# Patient Record
Sex: Female | Born: 1960 | Race: White | Hispanic: No | Marital: Married | Smoking: Current every day smoker
Health system: Southern US, Community
[De-identification: ages and names within clinical notes are randomized; demographics above are authoritative.]

## PROBLEM LIST (undated history)

## (undated) DIAGNOSIS — I10 Essential (primary) hypertension: Secondary | ICD-10-CM

## (undated) DIAGNOSIS — M722 Plantar fascial fibromatosis: Secondary | ICD-10-CM

## (undated) DIAGNOSIS — F1020 Alcohol dependence, uncomplicated: Secondary | ICD-10-CM

## (undated) DIAGNOSIS — K219 Gastro-esophageal reflux disease without esophagitis: Secondary | ICD-10-CM

## (undated) DIAGNOSIS — R1013 Epigastric pain: Secondary | ICD-10-CM

## (undated) DIAGNOSIS — K921 Melena: Secondary | ICD-10-CM

---

## 2005-03-29 ENCOUNTER — Emergency Department: Payer: Self-pay | Admitting: Emergency Medicine

## 2009-09-20 ENCOUNTER — Emergency Department: Payer: Self-pay | Admitting: Emergency Medicine

## 2010-01-05 ENCOUNTER — Emergency Department: Payer: Self-pay | Admitting: Emergency Medicine

## 2013-01-08 ENCOUNTER — Ambulatory Visit: Payer: Self-pay

## 2017-03-23 ENCOUNTER — Other Ambulatory Visit: Payer: Self-pay | Admitting: Podiatry

## 2017-03-23 ENCOUNTER — Other Ambulatory Visit: Payer: Self-pay

## 2017-03-23 ENCOUNTER — Telehealth: Payer: Self-pay | Admitting: Gastroenterology

## 2017-03-23 DIAGNOSIS — M79671 Pain in right foot: Secondary | ICD-10-CM

## 2017-03-23 DIAGNOSIS — M79672 Pain in left foot: Principal | ICD-10-CM

## 2017-03-23 DIAGNOSIS — Z1211 Encounter for screening for malignant neoplasm of colon: Secondary | ICD-10-CM

## 2017-03-23 NOTE — Telephone Encounter (Signed)
Gastroenterology Pre-Procedure Review  Request Date: 04/17/17 Requesting Physician: Dr. Allen Norris  PATIENT REVIEW QUESTIONS: The patient responded to the following health history questions as indicated:    1. Are you having any GI issues? yes (ULcer) 2. Do you have a personal history of Polyps? no 3. Do you have a family history of Colon Cancer or Polyps? no 4. Diabetes Mellitus? no 5. Joint replacements in the past 12 months?no 6. Major health problems in the past 3 months?no 7. Any artificial heart valves, MVP, or defibrillator?no    MEDICATIONS & ALLERGIES:    Patient reports the following regarding taking any anticoagulation/antiplatelet therapy:   Plavix, Coumadin, Eliquis, Xarelto, Lovenox, Pradaxa, Brilinta, or Effient? no Aspirin? yes (Baby aspirin)  Patient confirms/reports the following medications:  No current outpatient prescriptions on file.   No current facility-administered medications for this visit.     Patient confirms/reports the following allergies:  Allergies not on file  No orders of the defined types were placed in this encounter.   AUTHORIZATION INFORMATION Primary Insurance: 1D#: Group #:  Secondary Insurance: 1D#: Group #:  SCHEDULE INFORMATION: Date: 04/17/17 Time: Location:[facility]

## 2017-03-23 NOTE — Telephone Encounter (Signed)
Patient called and is ready to schedule her colonoscopy. 

## 2017-04-02 ENCOUNTER — Ambulatory Visit: Payer: Medicaid Other

## 2017-04-17 ENCOUNTER — Ambulatory Visit
Admission: RE | Admit: 2017-04-17 | Discharge: 2017-04-17 | Disposition: A | Discharge status: Home / Self Care | Payer: Medicaid Other | Source: Ambulatory Visit | Attending: Gastroenterology | Admitting: Gastroenterology

## 2017-04-17 ENCOUNTER — Encounter
Admission: RE | Disposition: A | Discharge status: Home / Self Care | Payer: Self-pay | Source: Ambulatory Visit | Attending: Gastroenterology

## 2017-04-17 ENCOUNTER — Encounter: Payer: Self-pay | Admitting: Anesthesiology

## 2017-04-17 ENCOUNTER — Ambulatory Visit: Payer: Medicaid Other | Admitting: Certified Registered Nurse Anesthetist

## 2017-04-17 DIAGNOSIS — D125 Benign neoplasm of sigmoid colon: Secondary | ICD-10-CM | POA: Insufficient documentation

## 2017-04-17 DIAGNOSIS — K640 First degree hemorrhoids: Secondary | ICD-10-CM | POA: Diagnosis not present

## 2017-04-17 DIAGNOSIS — K5730 Diverticulosis of large intestine without perforation or abscess without bleeding: Secondary | ICD-10-CM | POA: Diagnosis not present

## 2017-04-17 DIAGNOSIS — Z1211 Encounter for screening for malignant neoplasm of colon: Secondary | ICD-10-CM | POA: Diagnosis not present

## 2017-04-17 DIAGNOSIS — K635 Polyp of colon: Secondary | ICD-10-CM | POA: Diagnosis not present

## 2017-04-17 HISTORY — PX: COLONOSCOPY WITH PROPOFOL: SHX5780

## 2017-04-17 SURGERY — COLONOSCOPY WITH PROPOFOL
Anesthesia: General

## 2017-04-17 MED ORDER — LIDOCAINE HCL (PF) 1 % IJ SOLN
INTRAMUSCULAR | Status: AC
  Administered 2017-04-17: 0.3 mL via INTRADERMAL
  Filled 2017-04-17: qty 2

## 2017-04-17 MED ORDER — LIDOCAINE HCL (PF) 1 % IJ SOLN
2.0000 mL | Freq: Once | INTRAMUSCULAR | Status: AC
  Administered 2017-04-17: 0.3 mL via INTRADERMAL

## 2017-04-17 MED ORDER — PROPOFOL 500 MG/50ML IV EMUL
INTRAVENOUS | Status: AC
  Filled 2017-04-17: qty 50

## 2017-04-17 MED ORDER — PROPOFOL 10 MG/ML IV BOLUS
INTRAVENOUS | Status: AC
  Filled 2017-04-17: qty 60

## 2017-04-17 MED ORDER — SODIUM CHLORIDE 0.9 % IV SOLN
INTRAVENOUS | Status: DC
  Administered 2017-04-17: 1000 mL via INTRAVENOUS
  Administered 2017-04-17: 08:00:00 via INTRAVENOUS

## 2017-04-17 MED ORDER — LIDOCAINE HCL (CARDIAC) 20 MG/ML IV SOLN
INTRAVENOUS | Status: DC | PRN
  Administered 2017-04-17: 80 mg via INTRAVENOUS

## 2017-04-17 MED ORDER — PROPOFOL 10 MG/ML IV BOLUS
INTRAVENOUS | Status: DC | PRN
  Administered 2017-04-17: 40 mg via INTRAVENOUS
  Administered 2017-04-17: 30 mg via INTRAVENOUS
  Administered 2017-04-17: 50 mg via INTRAVENOUS

## 2017-04-17 MED ORDER — PROPOFOL 500 MG/50ML IV EMUL
INTRAVENOUS | Status: DC | PRN
  Administered 2017-04-17: 100 ug/kg/min via INTRAVENOUS

## 2017-04-17 NOTE — Anesthesia Preprocedure Evaluation (Addendum)
Anesthesia Evaluation  Patient identified by MRN, date of birth, ID band Patient awake    Reviewed: Allergy & Precautions, H&P , NPO status , Patient's Chart, lab work & pertinent test results  History of Anesthesia Complications Negative for: history of anesthetic complications  Airway Mallampati: III  TM Distance: <3 FB Neck ROM: limited    Dental  (+) Chipped, Poor Dentition, Missing, Edentulous Upper   Pulmonary asthma , COPD,           Cardiovascular Exercise Tolerance: Good (-) angina(-) Past MI negative cardio ROS       Neuro/Psych negative neurological ROS  negative psych ROS   GI/Hepatic negative GI ROS, Neg liver ROS, neg GERD  ,  Endo/Other  negative endocrine ROS  Renal/GU negative Renal ROS  negative genitourinary   Musculoskeletal   Abdominal   Peds  Hematology negative hematology ROS (+)   Anesthesia Other Findings  BMI    Body Mass Index:  22.13 kg/m      Reproductive/Obstetrics negative OB ROS                             Anesthesia Physical Anesthesia Plan  ASA: III  Anesthesia Plan: General   Post-op Pain Management:    Induction: Intravenous  PONV Risk Score and Plan: 3 and Propofol infusion  Airway Management Planned: Natural Airway and Nasal Cannula  Additional Equipment:   Intra-op Plan:   Post-operative Plan:   Informed Consent: I have reviewed the patients History and Physical, chart, labs and discussed the procedure including the risks, benefits and alternatives for the proposed anesthesia with the patient or authorized representative who has indicated his/her understanding and acceptance.   Dental Advisory Given  Plan Discussed with: Anesthesiologist, CRNA and Surgeon  Anesthesia Plan Comments: (Patient consented for risks of anesthesia including but not limited to:  - adverse reactions to medications - risk of intubation if required -  damage to teeth, lips or other oral mucosa - sore throat or hoarseness - Damage to heart, brain, lungs or loss of life  Patient voiced understanding.)       Anesthesia Quick Evaluation

## 2017-04-17 NOTE — Anesthesia Post-op Follow-up Note (Signed)
Anesthesia QCDR form completed.        

## 2017-04-17 NOTE — H&P (Signed)
   Lucilla Lame, MD [address] [place], [place] [address] Phone: [phone] Fax : [phone]  Primary Care Physician:  Letta Median, MD Primary Gastroenterologist:  Dr. Allen Norris  Pre-Procedure History & Physical: HPI:  Allison Barry is a 56 y.o. female is here for a screening colonoscopy.   History reviewed. No pertinent past medical history.  History reviewed. No pertinent surgical history.  Prior to Admission medications   Not on File    Allergies as of 03/26/2017  . (Not on File)    History reviewed. No pertinent family history.  Social History   Socioeconomic History  . Marital status: Married    Spouse name: Not on file  . Number of children: Not on file  . Years of education: Not on file  . Highest education level: Not on file  Social Needs  . Financial resource strain: Not on file  . Food insecurity - worry: Not on file  . Food insecurity - inability: Not on file  . Transportation needs - medical: Not on file  . Transportation needs - non-medical: Not on file  Occupational History  . Not on file  Tobacco Use  . Smoking status: Not on file  Substance and Sexual Activity  . Alcohol use: Not on file  . Drug use: Not on file  . Sexual activity: Not on file  Other Topics Concern  . Not on file  Social History Narrative  . Not on file    Review of Systems: See HPI, otherwise negative ROS  Physical Exam: BP (!) 160/88   Pulse 85   Temp 97.7 F (36.5 C) (Tympanic)   Resp 17   Ht 5\' 2"  (1.575 m)   Wt 121 lb (54.9 kg)   SpO2 100%   BMI 22.13 kg/m  General:   Alert,  pleasant and cooperative in NAD Head:  Normocephalic and atraumatic. Neck:  Supple; no masses or thyromegaly. Lungs:  Clear throughout to auscultation.    Heart:  Regular rate and rhythm. Abdomen:  Soft, nontender and nondistended. Normal bowel sounds, without guarding, and without rebound.   Neurologic:  Alert and  oriented x4;  grossly normal  neurologically.  Impression/Plan: Allison Barry is now here to undergo a screening colonoscopy.  Risks, benefits, and alternatives regarding colonoscopy have been reviewed with the patient.  Questions have been answered.  All parties agreeable.

## 2017-04-17 NOTE — Transfer of Care (Signed)
Immediate Anesthesia Transfer of Care Note  Patient: Allison Barry  Procedure(s) Performed: COLONOSCOPY WITH PROPOFOL (N/A )  Patient Location: PACU and Endoscopy Unit  Anesthesia Type:General  Level of Consciousness: awake, alert , oriented and patient cooperative  Airway & Oxygen Therapy: Patient Spontanous Breathing  Post-op Assessment: Report given to RN, Post -op Vital signs reviewed and stable and Patient moving all extremities  Post vital signs: Reviewed and stable  Last Vitals:  Vitals:   04/17/17 0742 04/17/17 0840  BP: (!) 160/88 118/82  Pulse: 85 92  Resp: 17 18  Temp: 36.5 C (!) 36.3 C  SpO2: 100% 100%    Last Pain:  Vitals:   04/17/17 0840  TempSrc: Tympanic         Complications: No apparent anesthesia complications

## 2017-04-17 NOTE — Op Note (Signed)
[facility] Gastroenterology Patient Name: Allison Barry Procedure Date: 04/17/2017 8:02 AM MRN: [id number] Account #: [id number] Date of Birth: 08-11-1960 Admit Type: Outpatient Age: 56 Room: [facility] ENDO ROOM 4 Gender: Female Note Status: Finalized Procedure:            Colonoscopy Indications:          Screening for colorectal malignant neoplasm Providers:            Lucilla Lame MD, MD Referring MD:         Baxter Kail. Rebeca Alert MD, MD (Referring MD) Medicines:            Propofol per Anesthesia Complications:        No immediate complications. Procedure:            Pre-Anesthesia Assessment:                       - Prior to the procedure, a History and Physical was                        performed, and patient medications and allergies were                        reviewed. The patient's tolerance of previous                        anesthesia was also reviewed. The risks and benefits of                        the procedure and the sedation options and risks were                        discussed with the patient. All questions were                        answered, and informed consent was obtained. Prior                        Anticoagulants: The patient has taken no previous                        anticoagulant or antiplatelet agents. ASA Grade                        Assessment: II - A patient with mild systemic disease.                        After reviewing the risks and benefits, the patient was                        deemed in satisfactory condition to undergo the                        procedure.                       After obtaining informed consent, the colonoscope was                        passed under direct vision. Throughout the procedure,  the patient's blood pressure, pulse, and oxygen                        saturations were monitored continuously. The                        Colonoscope was introduced through the anus and                         advanced to the the cecum, identified by appendiceal                        orifice and ileocecal valve. The colonoscopy was                        performed without difficulty. The patient tolerated the                        procedure well. The quality of the bowel preparation                        was fair. Findings:      The perianal and digital rectal examinations were normal.      A 7 mm polyp was found in the sigmoid colon. The polyp was pedunculated.       The polyp was removed with a hot snare. Resection and retrieval were       complete.      A 3 mm polyp was found in the sigmoid colon. The polyp was sessile. The       polyp was removed with a cold snare. Resection and retrieval were       complete.      Multiple small-mouthed diverticula were found in the sigmoid colon.      Non-bleeding internal hemorrhoids were found during retroflexion. The       hemorrhoids were Grade I (internal hemorrhoids that do not prolapse). Impression:           - Preparation of the colon was fair.                       - One 7 mm polyp in the sigmoid colon, removed with a                        hot snare. Resected and retrieved.                       - One 3 mm polyp in the sigmoid colon, removed with a                        cold snare. Resected and retrieved.                       - Diverticulosis in the sigmoid colon.                       - Non-bleeding internal hemorrhoids. Recommendation:       - Await pathology results.                       - Discharge patient to home.                       -  Resume previous diet.                       - Continue present medications.                       - Repeat colonoscopy in 3 years if polyp adenoma and 10                        years if hyperplastic Procedure Code(s):    --- Professional ---                       [phone], Colonoscopy, flexible; with removal of tumor(s),                        polyp(s), or other lesion(s) by snare  technique Diagnosis Code(s):    --- Professional ---                       Z12.11, Encounter for screening for malignant neoplasm                        of colon                       D12.5, Benign neoplasm of sigmoid colon CPT copyright 2016 American Medical Association. All rights reserved. The codes documented in this report are preliminary and upon coder review may  be revised to meet current compliance requirements. Lucilla Lame MD, MD 04/17/2017 8:42:18 AM This report has been signed electronically. Number of Addenda: 0 Note Initiated On: 04/17/2017 8:02 AM Scope Withdrawal Time: 0 hours 17 minutes 27 seconds  Total Procedure Duration: 0 hours 30 minutes 23 seconds       [facility]

## 2017-04-17 NOTE — Anesthesia Postprocedure Evaluation (Signed)
Anesthesia Post Note  Patient: Allison Barry  Procedure(s) Performed: COLONOSCOPY WITH PROPOFOL (N/A )  Patient location during evaluation: Endoscopy Anesthesia Type: General Level of consciousness: awake and alert Pain management: pain level controlled Vital Signs Assessment: post-procedure vital signs reviewed and stable Respiratory status: spontaneous breathing, nonlabored ventilation, respiratory function stable and patient connected to nasal cannula oxygen Cardiovascular status: blood pressure returned to baseline and stable Postop Assessment: no apparent nausea or vomiting Anesthetic complications: no     Last Vitals:  Vitals:   04/17/17 0910 04/17/17 0920  BP: (!) 154/87 (!) 156/87  Pulse:  76  Resp:  17  Temp:    SpO2:  100%    Last Pain:  Vitals:   04/17/17 0840  TempSrc: Tympanic                 Precious Haws Piscitello

## 2017-04-18 ENCOUNTER — Encounter: Payer: Self-pay | Admitting: Gastroenterology

## 2017-04-18 LAB — SURGICAL PATHOLOGY

## 2017-04-19 ENCOUNTER — Encounter: Payer: Self-pay | Admitting: Gastroenterology

## 2017-06-30 ENCOUNTER — Ambulatory Visit
Admission: RE | Admit: 2017-06-30 | Discharge: 2017-06-30 | Disposition: A | Discharge status: Home / Self Care | Payer: Medicaid Other | Source: Ambulatory Visit | Attending: Podiatry | Admitting: Podiatry

## 2017-06-30 ENCOUNTER — Encounter: Payer: Self-pay | Admitting: Radiology

## 2017-06-30 DIAGNOSIS — M7752 Other enthesopathy of left foot: Secondary | ICD-10-CM | POA: Insufficient documentation

## 2017-06-30 DIAGNOSIS — M898X7 Other specified disorders of bone, ankle and foot: Secondary | ICD-10-CM | POA: Insufficient documentation

## 2017-06-30 DIAGNOSIS — M79671 Pain in right foot: Secondary | ICD-10-CM

## 2017-06-30 DIAGNOSIS — M85671 Other cyst of bone, right ankle and foot: Secondary | ICD-10-CM | POA: Diagnosis present

## 2017-06-30 DIAGNOSIS — M79672 Pain in left foot: Secondary | ICD-10-CM

## 2017-06-30 LAB — POCT I-STAT CREATININE: Creatinine, Ser: 0.6 mg/dL (ref 0.44–1.00)

## 2017-06-30 MED ORDER — GADOBENATE DIMEGLUMINE 529 MG/ML IV SOLN
11.0000 mL | Freq: Once | INTRAVENOUS | Status: AC | PRN
  Administered 2017-06-30: 11 mL via INTRAVENOUS

## 2017-07-31 ENCOUNTER — Other Ambulatory Visit: Payer: Self-pay | Admitting: Nurse Practitioner

## 2017-07-31 DIAGNOSIS — K921 Melena: Secondary | ICD-10-CM

## 2017-07-31 DIAGNOSIS — F1020 Alcohol dependence, uncomplicated: Secondary | ICD-10-CM

## 2017-07-31 DIAGNOSIS — R1013 Epigastric pain: Secondary | ICD-10-CM

## 2017-08-13 ENCOUNTER — Ambulatory Visit
Admission: RE | Admit: 2017-08-13 | Discharge: 2017-08-13 | Disposition: A | Discharge status: Home / Self Care | Payer: Medicaid Other | Source: Ambulatory Visit | Attending: Nurse Practitioner | Admitting: Nurse Practitioner

## 2017-08-13 DIAGNOSIS — K921 Melena: Secondary | ICD-10-CM | POA: Diagnosis not present

## 2017-08-13 DIAGNOSIS — R1013 Epigastric pain: Secondary | ICD-10-CM | POA: Diagnosis not present

## 2017-08-13 DIAGNOSIS — I700 Atherosclerosis of aorta: Secondary | ICD-10-CM | POA: Diagnosis not present

## 2017-08-13 DIAGNOSIS — R911 Solitary pulmonary nodule: Secondary | ICD-10-CM | POA: Insufficient documentation

## 2017-08-13 DIAGNOSIS — F1020 Alcohol dependence, uncomplicated: Secondary | ICD-10-CM | POA: Insufficient documentation

## 2017-08-13 HISTORY — DX: Essential (primary) hypertension: I10

## 2017-08-13 MED ORDER — IOPAMIDOL (ISOVUE-300) INJECTION 61%
75.0000 mL | Freq: Once | INTRAVENOUS | Status: AC | PRN
  Administered 2017-08-13: 75 mL via INTRAVENOUS

## 2017-08-23 ENCOUNTER — Encounter: Payer: Self-pay | Admitting: *Deleted

## 2017-08-24 ENCOUNTER — Encounter
Admission: RE | Disposition: A | Discharge status: Home / Self Care | Payer: Self-pay | Source: Ambulatory Visit | Attending: Unknown Physician Specialty

## 2017-08-24 ENCOUNTER — Ambulatory Visit: Payer: Medicaid Other | Admitting: Certified Registered Nurse Anesthetist

## 2017-08-24 ENCOUNTER — Encounter: Payer: Self-pay | Admitting: Certified Registered Nurse Anesthetist

## 2017-08-24 ENCOUNTER — Ambulatory Visit
Admission: RE | Admit: 2017-08-24 | Discharge: 2017-08-24 | Disposition: A | Discharge status: Home / Self Care | Payer: Medicaid Other | Source: Ambulatory Visit | Attending: Unknown Physician Specialty | Admitting: Unknown Physician Specialty

## 2017-08-24 DIAGNOSIS — Z79899 Other long term (current) drug therapy: Secondary | ICD-10-CM | POA: Diagnosis not present

## 2017-08-24 DIAGNOSIS — F17210 Nicotine dependence, cigarettes, uncomplicated: Secondary | ICD-10-CM | POA: Diagnosis not present

## 2017-08-24 DIAGNOSIS — R1013 Epigastric pain: Secondary | ICD-10-CM | POA: Diagnosis present

## 2017-08-24 DIAGNOSIS — F1020 Alcohol dependence, uncomplicated: Secondary | ICD-10-CM | POA: Insufficient documentation

## 2017-08-24 DIAGNOSIS — K2930 Chronic superficial gastritis without bleeding: Secondary | ICD-10-CM | POA: Insufficient documentation

## 2017-08-24 DIAGNOSIS — K449 Diaphragmatic hernia without obstruction or gangrene: Secondary | ICD-10-CM | POA: Diagnosis not present

## 2017-08-24 DIAGNOSIS — I10 Essential (primary) hypertension: Secondary | ICD-10-CM | POA: Insufficient documentation

## 2017-08-24 DIAGNOSIS — K219 Gastro-esophageal reflux disease without esophagitis: Secondary | ICD-10-CM | POA: Insufficient documentation

## 2017-08-24 HISTORY — DX: Gastro-esophageal reflux disease without esophagitis: K21.9

## 2017-08-24 HISTORY — PX: ESOPHAGOGASTRODUODENOSCOPY (EGD) WITH PROPOFOL: SHX5813

## 2017-08-24 HISTORY — DX: Alcohol dependence, uncomplicated: F10.20

## 2017-08-24 HISTORY — DX: Melena: K92.1

## 2017-08-24 HISTORY — DX: Plantar fascial fibromatosis: M72.2

## 2017-08-24 HISTORY — DX: Epigastric pain: R10.13

## 2017-08-24 LAB — URINE DRUG SCREEN, QUALITATIVE (ARMC ONLY)
Amphetamines, Ur Screen: NOT DETECTED
BARBITURATES, UR SCREEN: NOT DETECTED
BENZODIAZEPINE, UR SCRN: NOT DETECTED
CANNABINOID 50 NG, UR ~~LOC~~: NOT DETECTED
COCAINE METABOLITE, UR ~~LOC~~: NOT DETECTED
MDMA (Ecstasy)Ur Screen: NOT DETECTED
METHADONE SCREEN, URINE: NOT DETECTED
OPIATE, UR SCREEN: NOT DETECTED
Phencyclidine (PCP) Ur S: NOT DETECTED
Tricyclic, Ur Screen: NOT DETECTED

## 2017-08-24 SURGERY — ESOPHAGOGASTRODUODENOSCOPY (EGD) WITH PROPOFOL
Anesthesia: General

## 2017-08-24 MED ORDER — SODIUM CHLORIDE 0.9 % IV SOLN
INTRAVENOUS | Status: DC
  Administered 2017-08-24: 1000 mL via INTRAVENOUS

## 2017-08-24 MED ORDER — LIDOCAINE HCL (PF) 1 % IJ SOLN
INTRAMUSCULAR | Status: AC
  Administered 2017-08-24: 0.3 mL
  Filled 2017-08-24: qty 2

## 2017-08-24 MED ORDER — SODIUM CHLORIDE 0.9 % IV SOLN: INTRAVENOUS | Status: DC

## 2017-08-24 MED ORDER — LIDOCAINE HCL (CARDIAC) 20 MG/ML IV SOLN
INTRAVENOUS | Status: DC | PRN
  Administered 2017-08-24: 50 mg via INTRAVENOUS

## 2017-08-24 MED ORDER — LIDOCAINE HCL (PF) 2 % IJ SOLN
INTRAMUSCULAR | Status: AC
  Filled 2017-08-24: qty 10

## 2017-08-24 MED ORDER — PROPOFOL 10 MG/ML IV BOLUS
INTRAVENOUS | Status: DC | PRN
  Administered 2017-08-24: 290 mg via INTRAVENOUS

## 2017-08-24 MED ORDER — PROPOFOL 500 MG/50ML IV EMUL
INTRAVENOUS | Status: AC
  Filled 2017-08-24: qty 50

## 2017-08-24 NOTE — Anesthesia Preprocedure Evaluation (Signed)
Anesthesia Evaluation  Patient identified by MRN, date of birth, ID band Patient awake    Reviewed: Allergy & Precautions, H&P , NPO status , Patient's Chart, lab work & pertinent test results  History of Anesthesia Complications Negative for: history of anesthetic complications  Airway Mallampati: III  TM Distance: <3 FB Neck ROM: limited    Dental  (+) Chipped, Poor Dentition, Missing, Edentulous Upper   Pulmonary neg shortness of breath, asthma , COPD,  COPD inhaler, neg recent URI, Current Smoker,           Cardiovascular Exercise Tolerance: Good hypertension, (-) angina(-) Past MI negative cardio ROS       Neuro/Psych negative neurological ROS  negative psych ROS   GI/Hepatic negative GI ROS, Neg liver ROS, neg GERD  ,  Endo/Other  negative endocrine ROS  Renal/GU negative Renal ROS  negative genitourinary   Musculoskeletal   Abdominal   Peds  Hematology negative hematology ROS (+)   Anesthesia Other Findings  BMI    Body Mass Index:  22.13 kg/m      Reproductive/Obstetrics negative OB ROS                             Anesthesia Physical  Anesthesia Plan  ASA: III  Anesthesia Plan: General   Post-op Pain Management:    Induction: Intravenous  PONV Risk Score and Plan: 3 and Propofol infusion  Airway Management Planned: Natural Airway and Nasal Cannula  Additional Equipment:   Intra-op Plan:   Post-operative Plan:   Informed Consent: I have reviewed the patients History and Physical, chart, labs and discussed the procedure including the risks, benefits and alternatives for the proposed anesthesia with the patient or authorized representative who has indicated his/her understanding and acceptance.   Dental Advisory Given  Plan Discussed with: Anesthesiologist, CRNA and Surgeon  Anesthesia Plan Comments: (Patient consented for risks of anesthesia including but  not limited to:  - adverse reactions to medications - risk of intubation if required - damage to teeth, lips or other oral mucosa - sore throat or hoarseness - Damage to heart, brain, lungs or loss of life  Patient voiced understanding.)        Anesthesia Quick Evaluation

## 2017-08-24 NOTE — Transfer of Care (Signed)
Immediate Anesthesia Transfer of Care Note  Patient: Allison Barry  Procedure(s) Performed: ESOPHAGOGASTRODUODENOSCOPY (EGD) WITH PROPOFOL (N/A )  Patient Location: PACU and Endoscopy Unit  Anesthesia Type:General  Level of Consciousness: drowsy  Airway & Oxygen Therapy: Patient Spontanous Breathing and Patient connected to nasal cannula oxygen  Post-op Assessment: Report given to RN and Post -op Vital signs reviewed and stable  Post vital signs: Reviewed and stable  Last Vitals:  Vitals:   08/24/17 1128  BP: 139/82  Pulse: 83  Resp: 17  Temp: 36.7 C  SpO2: 99%    Last Pain:  Vitals:   08/24/17 1128  TempSrc: Tympanic         Complications: No apparent anesthesia complications

## 2017-08-24 NOTE — Anesthesia Post-op Follow-up Note (Signed)
Anesthesia QCDR form completed.        

## 2017-08-24 NOTE — H&P (Signed)
   Primary Care Physician:  Letta Median, MD Primary Gastroenterologist:  Dr. Vira Agar  Pre-Procedure History & Physical: HPI:  Allison Barry is a 57 y.o. female is here for an endoscopy which is for abdominal pain.   Past Medical History:  Diagnosis Date  . Alcoholism ([place])   . Epigastric pain   . GERD (gastroesophageal reflux disease)   . Hypertension   . Melena   . Plantar fasciitis     Past Surgical History:  Procedure Laterality Date  . COLONOSCOPY WITH PROPOFOL N/A 04/17/2017   Procedure: COLONOSCOPY WITH PROPOFOL;  Surgeon: Lucilla Lame, MD;  Location: [facility] ENDOSCOPY;  Service: Endoscopy;  Laterality: N/A;    Prior to Admission medications   Medication Sig Start Date End Date Taking? Authorizing Provider  hydrochlorothiazide (MICROZIDE) 12.5 MG capsule Take 12.5 mg by mouth daily.   Yes [provider]  ibuprofen (ADVIL) 200 MG tablet Take 200 mg by mouth every 6 (six) hours as needed.   Yes [provider]  pantoprazole (PROTONIX) 40 MG tablet Take 40 mg by mouth daily.   Yes [provider]  traMADol (ULTRAM) 50 MG tablet Take by mouth every 6 (six) hours as needed.   Yes [provider]    Allergies as of 08/20/2017 - Review Complete 08/13/2017  Allergen Reaction Noted  . Penicillins  04/17/2017    History reviewed. No pertinent family history.  Social History   Socioeconomic History  . Marital status: Married    Spouse name: Not on file  . Number of children: Not on file  . Years of education: Not on file  . Highest education level: Not on file  Social Needs  . Financial resource strain: Not on file  . Food insecurity - worry: Not on file  . Food insecurity - inability: Not on file  . Transportation needs - medical: Not on file  . Transportation needs - non-medical: Not on file  Occupational History  . Not on file  Tobacco Use  . Smoking status: Current Every Day Smoker    Packs/day: 1.00  . Smokeless  tobacco: Never Used  Substance and Sexual Activity  . Alcohol use: Yes    Comment: 7 quarts  . Drug use: No  . Sexual activity: Not on file  Other Topics Concern  . Not on file  Social History Narrative  . Not on file    Review of Systems: See HPI, otherwise negative ROS  Physical Exam: BP (!) 82/58   Pulse 87   Temp (!) 97.4 F (36.3 C)   Resp 18   Ht 5\' 2"  (1.575 m)   Wt 56.7 kg (125 lb)   SpO2 97%   BMI 22.86 kg/m  General:   Alert,  pleasant and cooperative in NAD Head:  Normocephalic and atraumatic. Neck:  Supple; no masses or thyromegaly. Lungs:  Clear throughout to auscultation.    Heart:  Regular rate and rhythm. Abdomen:  Soft, nontender and nondistended. Normal bowel sounds, without guarding, and without rebound.   Neurologic:  Alert and  oriented x4;  grossly normal neurologically.  Impression/Plan: Allison Barry is here for an endoscopy to be performed for abdominal pain.  Risks, benefits, limitations, and alternatives regarding  endoscopy have been reviewed with the patient.  Questions have been answered.  All parties agreeable.   Gaylyn Cheers, MD  08/24/2017, 2:47 PM

## 2017-08-24 NOTE — Op Note (Signed)
[facility] Gastroenterology Patient Name: [facility] Procedure Date: 08/24/2017 2:11 PM MRN: [id number] Account #: [id number] Date of Birth: 03/04/1961 Admit Type: Outpatient Age: 57 Room: [facility] ENDO ROOM 1 Gender: Female Note Status: Finalized Procedure:            Upper GI endoscopy Indications:          Epigastric abdominal pain Providers:            Manya Silvas, MD Medicines:            Propofol per Anesthesia Complications:        No immediate complications. Procedure:            Pre-Anesthesia Assessment:                       - After reviewing the risks and benefits, the patient                        was deemed in satisfactory condition to undergo the                        procedure.                       After obtaining informed consent, the endoscope was                        passed under direct vision. Throughout the procedure,                        the patient's blood pressure, pulse, and oxygen                        saturations were monitored continuously. The Endoscope                        was introduced through the mouth, and advanced to the                        second part of duodenum. The upper GI endoscopy was                        accomplished without difficulty. The patient tolerated                        the procedure well. Findings:      The examined esophagus was normal.      A large hiatal hernia was present. GEJ at 35cm and a 5cm hiatal hernia       was seen.      Localized mild inflammation characterized by erythema and granularity       was found in the gastric body and in the gastric antrum. Biopsies were       taken with a cold forceps for histology. Biopsies were taken with a cold       forceps for Helicobacter pylori testing.      The examined duodenum was normal. Impression:           - Normal esophagus.                       - Large hiatal hernia.                       -  Gastritis. Biopsied.         - Normal examined duodenum. Recommendation:       - Await pathology results. Take medicine. Manya Silvas, MD 08/24/2017 2:36:46 PM This report has been signed electronically. Number of Addenda: 0 Note Initiated On: 08/24/2017 2:11 PM      [facility]

## 2017-08-27 ENCOUNTER — Encounter: Payer: Self-pay | Admitting: Unknown Physician Specialty

## 2017-08-27 NOTE — Anesthesia Postprocedure Evaluation (Signed)
Anesthesia Post Note  Patient: Juanette K Malcomb  Procedure(s) Performed: ESOPHAGOGASTRODUODENOSCOPY (EGD) WITH PROPOFOL (N/A )  Patient location during evaluation: Endoscopy Anesthesia Type: General Level of consciousness: awake and alert Pain management: pain level controlled Vital Signs Assessment: post-procedure vital signs reviewed and stable Respiratory status: spontaneous breathing, nonlabored ventilation and respiratory function stable Cardiovascular status: blood pressure returned to baseline and stable Postop Assessment: no apparent nausea or vomiting Anesthetic complications: no     Last Vitals:  Vitals:   08/24/17 1128 08/24/17 1436  BP: 139/82 (!) 82/58  Pulse: 83 87  Resp: 17 18  Temp: 36.7 C (!) 36.3 C  SpO2: 99% 97%    Last Pain:  Vitals:   08/25/17 0907  TempSrc:   PainSc: 0-No pain                 Alphonsus Sias

## 2017-08-29 LAB — SURGICAL PATHOLOGY

## 2018-01-17 ENCOUNTER — Other Ambulatory Visit: Payer: Self-pay | Admitting: Student

## 2018-01-17 DIAGNOSIS — I700 Atherosclerosis of aorta: Secondary | ICD-10-CM

## 2018-01-17 DIAGNOSIS — R1013 Epigastric pain: Secondary | ICD-10-CM

## 2018-02-04 ENCOUNTER — Ambulatory Visit: Admission: RE | Admit: 2018-02-04 | Payer: Medicaid Other | Source: Ambulatory Visit

## 2018-03-05 ENCOUNTER — Ambulatory Visit: Payer: Medicaid Other | Admitting: Gastroenterology

## 2018-03-25 ENCOUNTER — Ambulatory Visit
Admission: RE | Admit: 2018-03-25 | Payer: Medicaid Other | Source: Ambulatory Visit | Admitting: Unknown Physician Specialty

## 2018-03-25 ENCOUNTER — Encounter: Admission: RE | Payer: Self-pay | Source: Ambulatory Visit

## 2018-03-25 IMAGING — CT CT ABD-PELV W/ CM
2 of 6 series · 15 of 46 positions shown, 17 images · IV contrast (iopamidol)
Comparison: None.

CLINICAL DATA: Mid abdominal pain. Remote history of ulcer.
Occasional nausea, vomiting, and diarrhea.

EXAM:
CT ABDOMEN AND PELVIS WITH CONTRAST
TECHNIQUE: Multidetector CT imaging of the abdomen and pelvis was performed
using the standard protocol following bolus administration of
intravenous contrast.
CONTRAST:  75mL 6TQP8H-ZMM IOPAMIDOL (6TQP8H-ZMM) INJECTION 61%

[Series 2: abd pelvis · axial · 0.65mm/px · z∈[-1536,-1206]mm · 12 of 78 slices shown, 14 images (1 of 2)]
[im 6/78  soft-tissue]
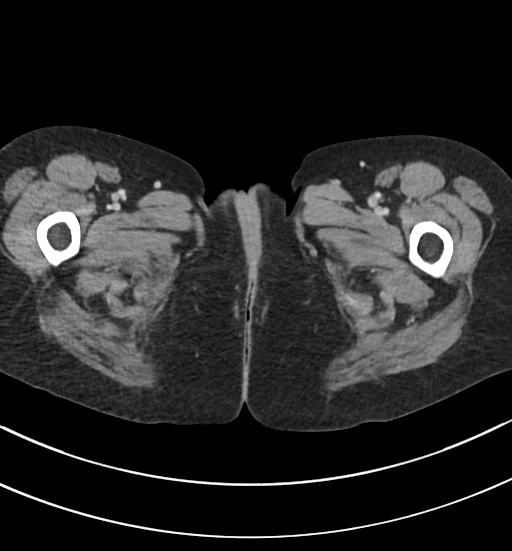
[im 6/78  bone]
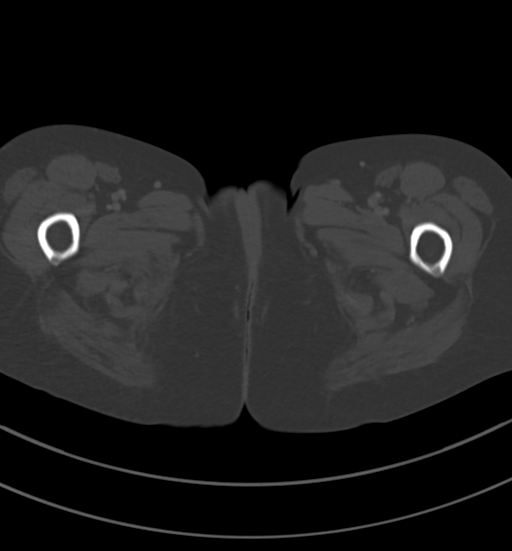
[im 11/78  soft-tissue]
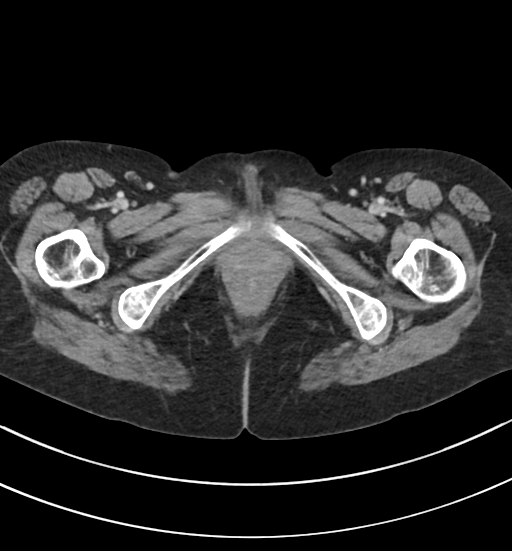
[im 16/78  soft-tissue]
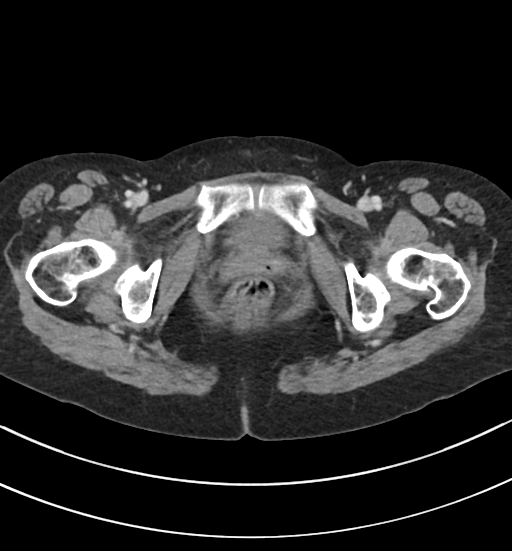
[im 26/78  soft-tissue]
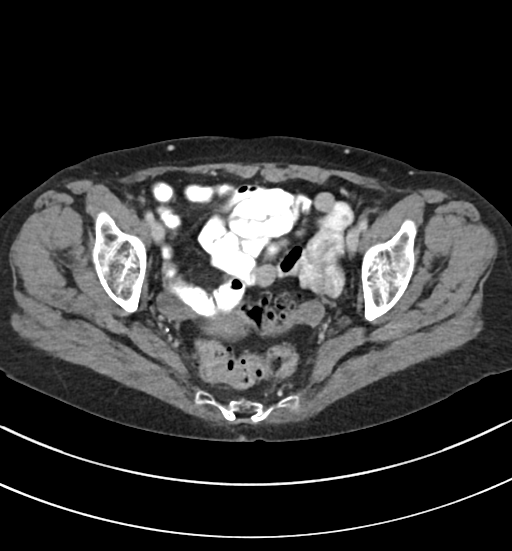
[im 31/78  soft-tissue]
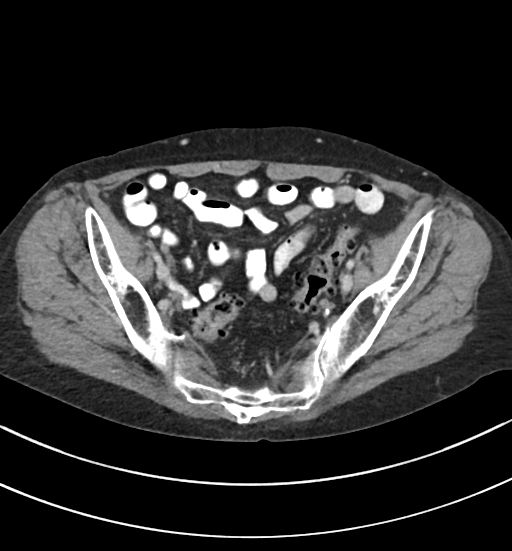
[im 36/78  soft-tissue]
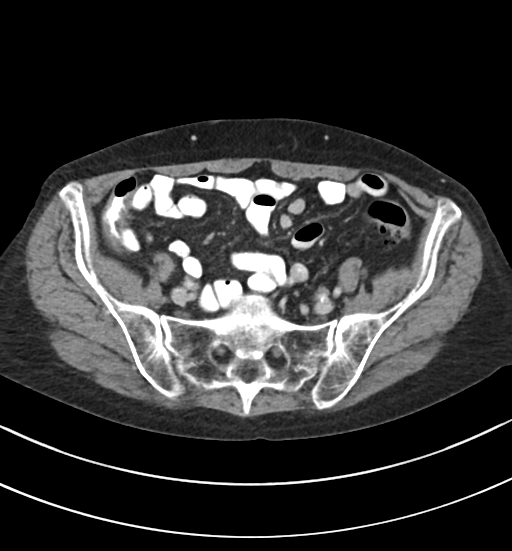
[im 42/78  soft-tissue]
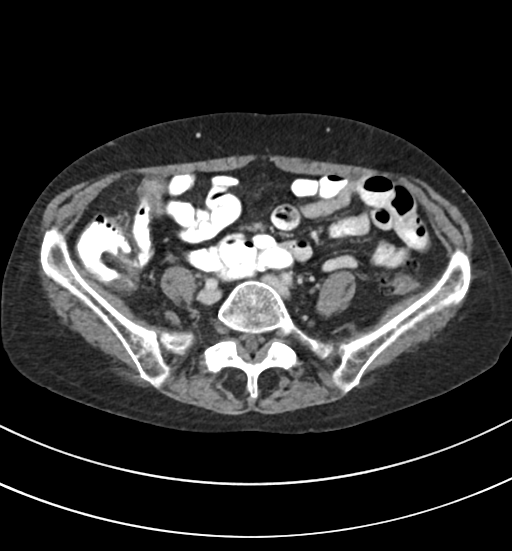
[im 47/78  soft-tissue]
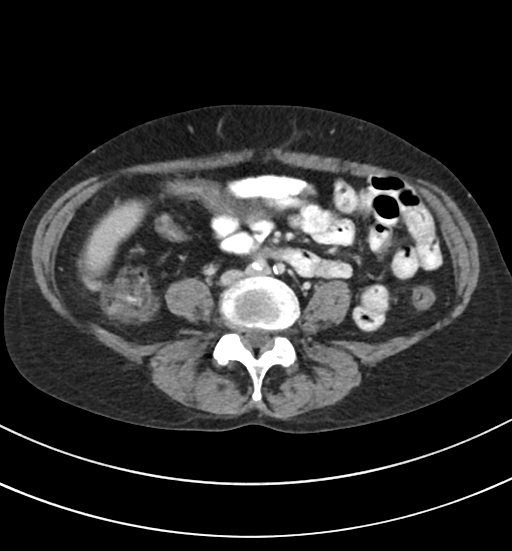
[im 52/78  soft-tissue]
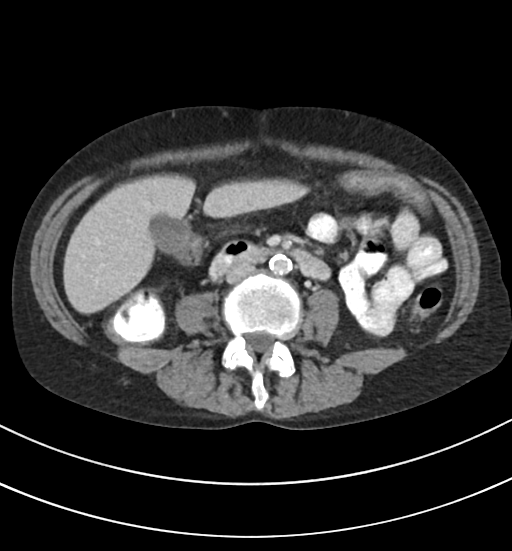
[im 52/78  bone]
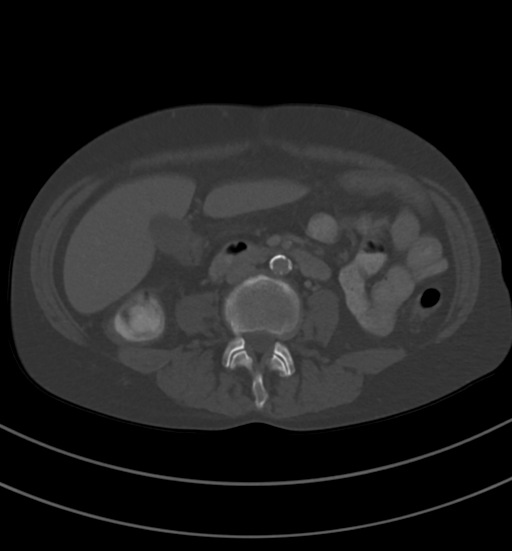
[im 62/78  soft-tissue]
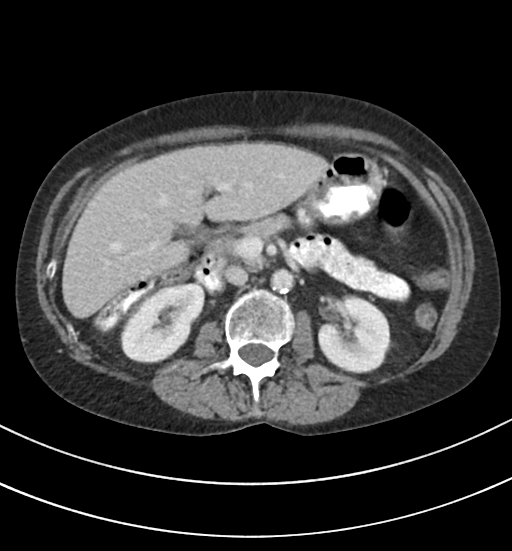
[im 67/78  soft-tissue]
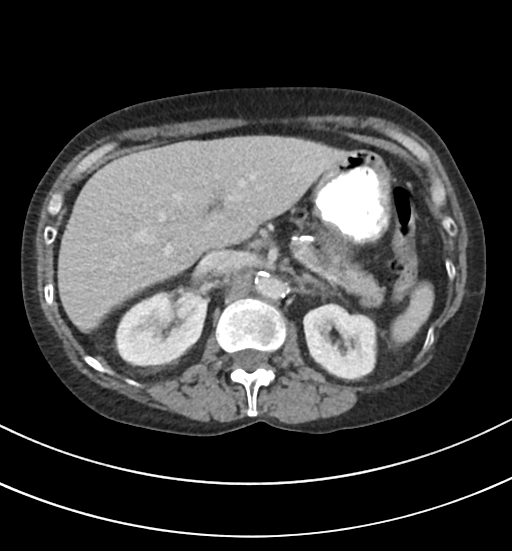
[im 72/78  soft-tissue]
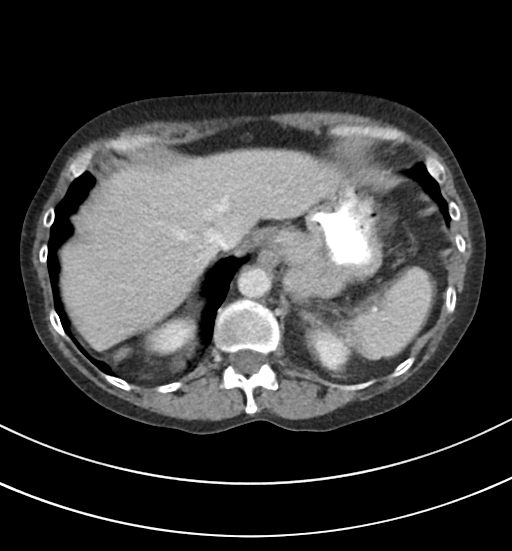

[Series 6: abd pelvis · coronal · 0.65mm/px · 3 of 116 slices shown (2 of 2)]
[im 39/116  soft-tissue]
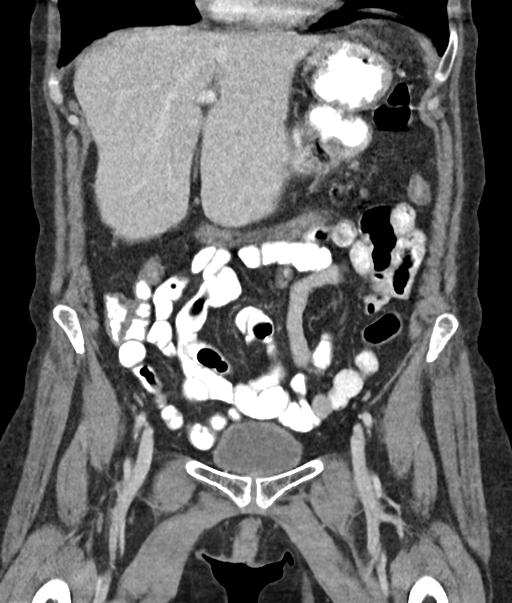
[im 52/116  soft-tissue]
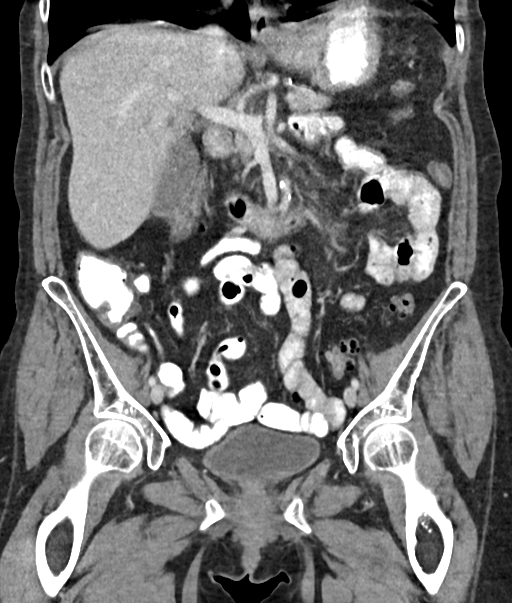
[im 64/116  soft-tissue]
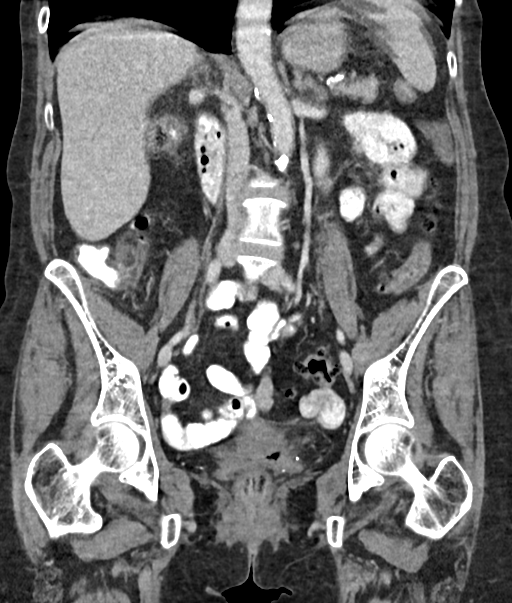

[15 of 46 positions shown; findings below may reference images not displayed]

FINDINGS: Lower chest: 4 mm right lower lobe pulmonary nodule on image [DATE].
Normal heart size without pericardial or pleural effusion.

Hepatobiliary: Minimal motion degradation in the upper abdomen on
both portal venous phase and delayed images. Normal liver. Normal
gallbladder, without biliary ductal dilatation.

Pancreas: Normal, without mass or ductal dilatation.

Spleen: Normal in size, without focal abnormality.

Adrenals/Urinary Tract: Normal adrenal glands. Normal kidneys,
without hydronephrosis. Normal urinary bladder.

Stomach/Bowel: Proximal gastric underdistention. Apparent wall
thickening is most likely secondary. Example image [DATE]. Scattered
colonic diverticula. Normal terminal ileum and appendix. Normal
small bowel.

Vascular/Lymphatic: Advanced aortic and branch vessel
atherosclerosis. No abdominopelvic adenopathy.

Reproductive: Normal uterus and adnexa.

Other: No significant free fluid.  Mild pelvic floor laxity.

Musculoskeletal: Remote right eleventh rib fracture. Mild disc
bulges at L2-3 and L3-4. Convex left lumbar spine curvature is mild.
IMPRESSION: 1. Mildly motion degraded exam.
2.  No acute process in the abdomen or pelvis.
3. Apparent soft tissue fullness in the proximal stomach is favored
to be due to underdistention. Given the complex clinical history,
including prior ulcer, gastric pathology cannot be excluded and
endoscopy should be considered.
4.  Aortic Atherosclerosis (ZC3OB-C2L.L).  This is age advanced.
5. Right lower lobe 4 mm pulmonary nodule. No follow-up needed if
patient is low-risk. Non-contrast chest CT can be considered in 12
months if patient is high-risk. This recommendation follows the
consensus statement: Guidelines for Management of Incidental
Pulmonary Nodules Detected on CT Images: From the [HOSPITAL]

## 2018-03-25 SURGERY — EGD (ESOPHAGOGASTRODUODENOSCOPY)
Anesthesia: General

## 2018-11-08 ENCOUNTER — Ambulatory Visit: Payer: Medicaid Other | Admitting: Adult Health

## 2018-11-08 ENCOUNTER — Other Ambulatory Visit: Payer: Self-pay

## 2019-12-02 ENCOUNTER — Ambulatory Visit
Admission: RE | Admit: 2019-12-02 | Discharge: 2019-12-02 | Disposition: A | Discharge status: Home / Self Care | Payer: Medicaid Other | Source: Ambulatory Visit | Attending: Family Medicine | Admitting: Family Medicine

## 2019-12-02 ENCOUNTER — Other Ambulatory Visit: Payer: Self-pay | Admitting: Family Medicine

## 2019-12-02 ENCOUNTER — Ambulatory Visit
Admission: RE | Admit: 2019-12-02 | Discharge: 2019-12-02 | Disposition: A | Discharge status: Home / Self Care | Payer: Medicaid Other | Attending: Family Medicine | Admitting: Family Medicine

## 2019-12-02 DIAGNOSIS — R600 Localized edema: Secondary | ICD-10-CM

## 2021-09-26 ENCOUNTER — Encounter: Payer: Self-pay | Admitting: Urology

## 2021-09-26 ENCOUNTER — Ambulatory Visit (INDEPENDENT_AMBULATORY_CARE_PROVIDER_SITE_OTHER): Payer: Medicaid Other | Admitting: Urology

## 2021-09-26 VITALS — BP 128/87 | HR 66 | Ht 62.0 in | Wt 125.0 lb

## 2021-09-26 DIAGNOSIS — N3946 Mixed incontinence: Secondary | ICD-10-CM | POA: Diagnosis not present

## 2021-09-26 MED ORDER — MIRABEGRON ER 50 MG PO TB24: 50.0000 mg | ORAL_TABLET | Freq: Every day | ORAL | 11 refills | Status: DC

## 2021-09-26 NOTE — Progress Notes (Signed)
? ?09/26/2021 ?9:22 AM  ? ?Allison Barry ?1961/05/01 ?[id number] ? ?Referring provider: Letta Median, MD ?[address] ?[place],  [place] [address] ? ?Chief Complaint  ?Patient presents with  ? Urinary Incontinence  ? ? ?HPI: ?I was consulted to assess the patient's urinary incontinence.  She leaks with urgency and coughing sneezing.  I think the primary symptom is urge incontinence.  She has high-volume bedwetting.  She soaks a pad at night and uses 4 pads during the day that are generally damp ? ?She voids every 1 hour and cannot hold it for 2 hours.  She gets up twice at night.  Her flow was good ? ?No hysterectomy ? ?No history of bladder surgery kidney stones or bladder infections.  No treatment ? ? ?PMH: ?Past Medical History:  ?Diagnosis Date  ? Alcoholism ([place])   ? Epigastric pain   ? GERD (gastroesophageal reflux disease)   ? Hypertension   ? Melena   ? Plantar fasciitis   ? ? ?Surgical History: ?Past Surgical History:  ?Procedure Laterality Date  ? COLONOSCOPY WITH PROPOFOL N/A 04/17/2017  ? Procedure: COLONOSCOPY WITH PROPOFOL;  Surgeon: Lucilla Lame, MD;  Location: [facility] ENDOSCOPY;  Service: Endoscopy;  Laterality: N/A;  ? ESOPHAGOGASTRODUODENOSCOPY (EGD) WITH PROPOFOL N/A 08/24/2017  ? Procedure: ESOPHAGOGASTRODUODENOSCOPY (EGD) WITH PROPOFOL;  Surgeon: Manya Silvas, MD;  Location[facility] END[facility]COPY;  Service: Endoscopy;  Laterality: N/A;  ? ? ?Home Medications:  ?Allergies as of 09/26/2021   ? ?   Reactions  ? Penicillins   ? ?  ? ?  ?Medication List  ?  ? ?  ? Accurate as of September 26, 2021  9:22 AM. If you have any questions, ask your nurse or doctor.  ?  ?  ? ?  ? ?hydrochlorothiazide 12.5 MG capsule ?Commonly known as: MICROZIDE ?Take 12.5 mg by mouth daily. ?  ?ibuprofen 200 MG tablet ?Commonly known as: ADVIL ?Take 200 mg by mouth every 6 (six) hours as needed. ?  ?pantoprazole 40 MG tablet ?Commonly known as: PROTONIX ?Take 40 mg by mouth daily. ?  ?traMADol 50 MG  tablet ?Commonly known as: ULTRAM ?Take by mouth every 6 (six) hours as needed. ?  ? ?  ? ? ?Allergies:  ?Allergies  ?Allergen Reactions  ? Penicillins   ? ? ?Family History: ?No family history on file. ? ?Social History:  reports that she has been smoking cigarettes. She has been smoking an average of 1 pack per day. She has never used smokeless tobacco. She reports current alcohol use. She reports that she does not use drugs. ? ?ROS: ?  ? ?  ? ?  ? ?  ? ?  ? ?  ? ?  ? ?  ? ?  ? ?  ? ?  ? ?  ? ?  ? ?Physical Exam: ?Ht '5\' 2"'$  (1.575 m)   Wt 56.7 kg   BMI 22.86 kg/m?   ?Constitutional:  Alert and oriented, No acute distress. ?HEENT: [place] AT, moist mucus membranes.  Trachea midline, no masses. ?Cardiovascular: No clubbing, cyanosis, or edema. ?Respiratory: Normal respiratory effort, no increased work of breathing. ?GI: Abdomen is soft, nontender, nondistended, no abdominal masses ?GU:  ?Skin: No rashes, bruises or suspicious lesions. ?Lymph: No cervical or inguinal adenopathy. ?Neurologic: Grossly intact, no focal deficits, moving all 4 extremities. ?Psychiatric: Normal mood and affect. ? ?Laboratory Data: ?No results found for: WBC, HGB, HCT, MCV, PLT ? ?Lab Results  ?Component Value Date  ?  CREATININE 0.60 06/30/2017  ? ? ?No results found for: PSA ? ?No results found for: TESTOSTERONE ? ?No results found for: HGBA1C ?Well supported bladder neck and no prolapse no stress incontinence with moderate cough ?Urinalysis ?No results found for: COLORURINE, APPEARANCEUR, [place], [place], [place], [place], [place], KETONESUR, PROTEINUR, [place], NITRITE, LEUKOCYTESUR ? ?Pertinent Imaging: ?Urine reviewed.  Urine sent for culture.  Chart reviewed ? ?Assessment & Plan: Patient has mixed incontinence and high-volume bedwetting.  She has hourly frequency and milder nocturia she may need urodynamics to reach her treatment goal.  Reassess with pelvic examination and cystoscopy in 5 or 6 weeks on Myrbetriq 50 mg samples and  prescription.  Call if urine culture is positive. ? ?Patient has a smoking history.  She had microscopic hematuria but a urine could have been infected.  If the culture comes back negative I will order CT scan. ? ?Patient understands he is coming back for cystoscopy.  I did not say she had blood in the urine because it looks like she had a bladder infection.  We will make sure we get a urine test next time and again I will explain microscopic hematuria if culture is negative ? ?1. Mixed incontinence ? ?- Urinalysis, Complete ? ? ?No follow-ups on file. ? ?Reece Packer, MD ? ?[facility] [place] ?[address] ?[place], [place] [address] ?[phone] ?  ?

## 2021-09-26 NOTE — Patient Instructions (Signed)

## 2021-09-27 LAB — URINALYSIS, COMPLETE
Bilirubin, UA: NEGATIVE
Glucose, UA: NEGATIVE
Ketones, UA: NEGATIVE
Nitrite, UA: NEGATIVE
Protein,UA: NEGATIVE
RBC, UA: NEGATIVE
Specific Gravity, UA: <1.005 — ABNORMAL LOW (ref 1.005–1.030)
Urobilinogen, Ur: 0.2 mg/dL (ref 0.2–1.0)
pH, UA: 6 (ref 5.0–7.5)

## 2021-09-27 LAB — MICROSCOPIC EXAMINATION: WBC, UA: >30 /hpf — AB (ref 0–5)

## 2021-09-29 LAB — CULTURE, URINE COMPREHENSIVE

## 2021-10-03 ENCOUNTER — Telehealth: Payer: Self-pay | Admitting: *Deleted

## 2021-10-03 MED ORDER — CIPROFLOXACIN HCL 250 MG PO TABS: 250.0000 mg | ORAL_TABLET | Freq: Two times a day (BID) | ORAL | 0 refills | Status: DC

## 2021-10-03 NOTE — Telephone Encounter (Addendum)
Patient informed, voiced understanding. Sent in RX ? ? ?----- Message from Bjorn Loser, MD sent at 10/03/2021  8:19 AM EDT ----- ?Ciprofloxacin 250 mg twice a day for 7 days ?----- Message ----- ?From: Alvera Novel, CMA ?Sent: 09/29/2021   8:20 AM EDT ?To: Bjorn Loser, MD ? ? ?----- Message ----- ?From: Interface, [facility] Lab Results In ?Sent: 09/27/2021   5:37 AM EDT ?To: Rowe [facility] Clinical ? ? ? ?

## 2021-11-08 ENCOUNTER — Telehealth: Payer: Self-pay

## 2021-11-08 NOTE — Telephone Encounter (Signed)
PA started on myrbetriq on cover my meds. KEY: [id number]

## 2021-11-14 ENCOUNTER — Other Ambulatory Visit: Payer: Medicaid Other | Admitting: Urology

## 2021-12-30 ENCOUNTER — Telehealth: Payer: Self-pay | Admitting: Urology

## 2021-12-30 NOTE — Telephone Encounter (Signed)
PT CALLED, RAN OUT OF MEDICATION - CIPRO.PLEASE PHONE INTO [facility] [address]

## 2021-12-30 NOTE — Telephone Encounter (Signed)
Called pt. Advised pt that we do not refill antibiotics. Pt is asymptomatic.

## 2022-01-03 NOTE — Telephone Encounter (Signed)
Patient called the office stating that her insurance will not cover her Myrbetriq prescription.  She cannot afford to pay for it.  2 boxes of samples were placed at the front desk to pick up for use until her next appointment.

## 2022-01-23 ENCOUNTER — Other Ambulatory Visit: Payer: Medicaid Other | Admitting: Urology

## 2022-02-15 ENCOUNTER — Telehealth: Payer: Self-pay | Admitting: Urology

## 2022-02-15 NOTE — Telephone Encounter (Signed)
Pt called, stating she has an yeast infection due to the RX Myrbetriq 50 mg.  Dody would like a RX to treat her yeast infection [facility] [address], [place].  Thank you. Pt number is [phone]

## 2022-02-16 NOTE — Telephone Encounter (Signed)
Patient returned call informed, will contact PCP

## 2022-02-16 NOTE — Telephone Encounter (Signed)
Voicemail not set up, will try again later, pt would need to follow-up with PCP or GYN.    The most common side effects of MYRBETRIQ include high blood pressure, pain or swelling of the nose or throat (nasopharyngitis), urinary tract infection, and headache.

## 2022-02-20 ENCOUNTER — Other Ambulatory Visit: Payer: Self-pay | Admitting: Family Medicine

## 2022-02-20 DIAGNOSIS — Z1231 Encounter for screening mammogram for malignant neoplasm of breast: Secondary | ICD-10-CM

## 2022-03-06 ENCOUNTER — Other Ambulatory Visit (HOSPITAL_COMMUNITY): Payer: Self-pay | Admitting: Family Medicine

## 2022-03-06 ENCOUNTER — Encounter: Payer: Self-pay | Admitting: Urology

## 2022-03-06 ENCOUNTER — Ambulatory Visit (INDEPENDENT_AMBULATORY_CARE_PROVIDER_SITE_OTHER): Payer: Medicaid Other | Admitting: Urology

## 2022-03-06 ENCOUNTER — Other Ambulatory Visit: Payer: Self-pay | Admitting: Family Medicine

## 2022-03-06 VITALS — BP 134/84 | HR 80 | Ht 62.0 in | Wt 120.0 lb

## 2022-03-06 DIAGNOSIS — N3946 Mixed incontinence: Secondary | ICD-10-CM

## 2022-03-06 DIAGNOSIS — R634 Abnormal weight loss: Secondary | ICD-10-CM

## 2022-03-06 LAB — MICROSCOPIC EXAMINATION: Bacteria, UA: NONE SEEN

## 2022-03-06 LAB — URINALYSIS, COMPLETE
Bilirubin, UA: NEGATIVE
Glucose, UA: NEGATIVE
Ketones, UA: NEGATIVE
Leukocytes,UA: NEGATIVE
Nitrite, UA: NEGATIVE
Protein,UA: NEGATIVE
RBC, UA: NEGATIVE
Specific Gravity, UA: <1.005 — ABNORMAL LOW (ref 1.005–1.030)
Urobilinogen, Ur: 0.2 mg/dL (ref 0.2–1.0)
pH, UA: 5.5 (ref 5.0–7.5)

## 2022-03-06 MED ORDER — OXYBUTYNIN CHLORIDE ER 10 MG PO TB24: 10.0000 mg | ORAL_TABLET | Freq: Every day | ORAL | 11 refills | Status: DC

## 2022-03-06 NOTE — Progress Notes (Signed)
03/06/2022 9:51 AM   Allison Barry 07/13/1960 [id number]  Referring provider: Letta Median, MD [place],  [place] [address]  Chief Complaint  Patient presents with   Cysto    HPI: I was consulted to assess the patient's urinary incontinence.  She leaks with urgency and coughing sneezing.  I think the primary symptom is urge incontinence.  She has high-volume bedwetting.  She soaks a pad at night and uses 4 pads during the day that are generally damp  She voids every 1 hour and cannot hold it for 2 hours.  She gets up twice at night.  Her flow was good  No hysterectomy    Patient has mixed incontinence and high-volume bedwetting.  She has hourly frequency and milder nocturia she may need urodynamics to reach her treatment goal.  Reassess with pelvic examination and cystoscopy in 5 or 6 weeks on Myrbetriq 50 mg samples and prescription.  Call if urine culture is positive.   Patient has a smoking history.  She had microscopic hematuria but a urine could have been infected.  If the culture comes back negative I will order CT scan.   Patient understands she is coming back for cystoscopy.  I did not say she had blood in the urine because it looks like she had a bladder infection.  We will make sure we get a urine test next time and again I will explain microscopic hematuria if culture is negative  TOday Frequency stable last culture positive In the last week patient has some mild nonspecific periumbilical abdominal pain. Urine today normal but I sent it for culture.  No blood in urine She was slurring a little bit today and we wondered if she may have been drinking.  She does have a past history of alcoholism.  On pelvic examination she grade 2 hypermobility and leaked a few drops after cystoscopy with a good cough.  No prolapse  Cystoscopy: Patient underwent flexible cystoscopy.  Bladder mucosa and trigone were normal.  During cystoscopy she  emptied almost her entire bladder volume around the scope.  She seemed to be unaware of this.   PMH: Past Medical History:  Diagnosis Date   Alcoholism ([place])    Epigastric pain    GERD (gastroesophageal reflux disease)    Hypertension    Melena    Plantar fasciitis     Surgical History: Past Surgical History:  Procedure Laterality Date   COLONOSCOPY WITH PROPOFOL N/A 04/17/2017   Procedure: COLONOSCOPY WITH PROPOFOL;  Surgeon: Lucilla Lame, MD;  Location: [facility] ENDOSCOPY;  Service: Endoscopy;  Laterality: N/A;   ESOPHAGOGASTRODUODENOSCOPY (EGD) WITH PROPOFOL N/A 08/24/2017   Procedure: ESOPHAGOGASTRODUODENOSCOPY (EGD) WITH PROPOFOL;  Surgeon: Manya Silvas, MD;  Location: [facility] ENDOSCOPY;  Service: Endoscopy;  Laterality: N/A;    Home Medications:  Allergies as of 03/06/2022       Reactions   Penicillins         Medication List        Accurate as of March 06, 2022  9:51 AM. If you have any questions, ask your nurse or doctor.          STOP taking these medications    ciprofloxacin 250 MG tablet Commonly known as: Cipro Stopped by: Reece Packer, MD       TAKE these medications    cetirizine 10 MG tablet Commonly known as: ZYRTEC Take 10 mg by mouth daily.   mirabegron ER 50 MG Tb24 tablet  Commonly known as: MYRBETRIQ Take 1 tablet (50 mg total) by mouth daily.   venlafaxine XR 150 MG 24 hr capsule Commonly known as: EFFEXOR-XR venlafaxine ER 150 mg capsule,extended release 24 hr   Ventolin HFA 108 (90 Base) MCG/ACT inhaler Generic drug: albuterol SMARTSIG:2 Puff(s) Via Inhaler 4 Times Daily PRN        Allergies:  Allergies  Allergen Reactions   Penicillins     Family History: No family history on file.  Social History:  reports that she has been smoking cigarettes. She has been smoking an average of 1 pack per day. She has been exposed to tobacco smoke. She has never used smokeless tobacco. She reports current alcohol use. She  reports that she does not use drugs.  ROS:                                        Physical Exam: BP 134/84   Pulse 80   Ht '5\' 2"'$  (1.575 m)   Wt 54.4 kg   BMI 21.95 kg/m   Constitutional:  Alert and oriented, No acute distress. HEENT: [place] AT, moist mucus membranes.  Trachea midline, no masses.  Laboratory Data: No results found for: "WBC", "HGB", "HCT", "MCV", "PLT"  Lab Results  Component Value Date   CREATININE 0.60 06/30/2017    No results found for: "PSA"  No results found for: "TESTOSTERONE"  No results found for: "HGBA1C"  Urinalysis    Component Value Date/Time   APPEARANCEUR [place] (A) 09/26/2021 0924   GLUCOSEU Negative 09/26/2021 0924   BILIRUBINUR Negative 09/26/2021 0924   PROTEINUR Negative 09/26/2021 0924   NITRITE Negative 09/26/2021 0924   LEUKOCYTESUR 3+ (A) 09/26/2021 0924    Pertinent Imaging: Urine reviewed and sent for culture  Assessment & Plan: Patient has mixed incontinence.  Based upon comorbidities and today's observations I think we can be more conservative and treat her as a primary overactive bladder moving forward.  I would not be performing the sling and her likely.  Reassess in 6 weeks on oxybutynin 10 mg 3x11.  She said the muscle relaxant helped her abdominal pain in the past.   1. Mixed incontinence  - Urinalysis, Complete   No follow-ups on file.  Reece Packer, MD  [place] [address], [place],  [address] [phone]

## 2022-03-09 LAB — CULTURE, URINE COMPREHENSIVE

## 2022-03-16 ENCOUNTER — Ambulatory Visit
Admission: RE | Admit: 2022-03-16 | Discharge: 2022-03-16 | Disposition: A | Discharge status: Home / Self Care | Payer: Medicaid Other | Source: Ambulatory Visit | Attending: Family Medicine | Admitting: Family Medicine

## 2022-03-16 DIAGNOSIS — R634 Abnormal weight loss: Secondary | ICD-10-CM | POA: Insufficient documentation

## 2022-04-17 ENCOUNTER — Encounter: Payer: Self-pay | Admitting: Urology

## 2022-04-17 ENCOUNTER — Ambulatory Visit (INDEPENDENT_AMBULATORY_CARE_PROVIDER_SITE_OTHER): Payer: Medicaid Other | Admitting: Urology

## 2022-04-17 VITALS — BP 137/90 | HR 108 | Ht 62.0 in | Wt 120.0 lb

## 2022-04-17 DIAGNOSIS — N3946 Mixed incontinence: Secondary | ICD-10-CM

## 2022-04-17 LAB — URINALYSIS, COMPLETE
Bilirubin, UA: NEGATIVE
Glucose, UA: NEGATIVE
Ketones, UA: NEGATIVE
Leukocytes,UA: NEGATIVE
Nitrite, UA: NEGATIVE
Protein,UA: NEGATIVE
RBC, UA: NEGATIVE
Specific Gravity, UA: <1.005 — ABNORMAL LOW (ref 1.005–1.030)
Urobilinogen, Ur: 0.2 mg/dL (ref 0.2–1.0)
pH, UA: 5.5 (ref 5.0–7.5)

## 2022-04-17 LAB — MICROSCOPIC EXAMINATION

## 2022-04-17 NOTE — Progress Notes (Signed)
04/17/2022 9:37 AM   Allison Barry 1960/10/11 [id number]  Referring provider: Letta Median, MD [place],  [place] [address]  Chief Complaint  Patient presents with   Urinary Incontinence    HPI:  was consulted to assess the patient's urinary incontinence.  She leaks with urgency and coughing sneezing.  I think the primary symptom is urge incontinence.  She has high-volume bedwetting.  She soaks a pad at night and uses 4 pads during the day that are generally damp  She voids every 1 hour and cannot hold it for 2 hours.  She gets up twice at night.  Her flow was good  No hysterectomy     Patient has mixed incontinence and high-volume bedwetting.  She has hourly frequency and milder nocturia she may need urodynamics to reach her treatment goal.  Reassess with pelvic examination and cystoscopy in 5 or 6 weeks on Myrbetriq 50 mg samples and prescription.  Call if urine culture is positive.   Patient has a smoking history.  She had microscopic hematuria but a urine could have been infected.  If the culture comes back negative I will order CT scan.   Patient understands she is coming back for cystoscopy.  I did not say she had blood in the urine because it looks like she had a bladder infection.  We will make sure we get a urine test next time and again I will explain microscopic hematuria if culture is negative   TOday Frequency stable last culture positive In the last week patient has some mild nonspecific periumbilical abdominal pain. Urine today normal but I sent it for culture.  No blood in urine She was slurring a little bit today and we wondered if she may have been drinking.  She does have a past history of alcoholism.   On pelvic examination she grade 2 hypermobility and leaked a few drops after cystoscopy with a good cough.  No prolapse   Cystoscopy: Patient underwent flexible cystoscopy.  Bladder mucosa and trigone were normal.  During  cystoscopy she emptied almost her entire bladder volume around the scope.  She seemed to be unaware of this.  Patient has mixed incontinence.  Based upon comorbidities and today's observations I think we can be more conservative and treat her as a primary overactive bladder moving forward.  I would not be performing the sling and her likely.  Reassess in 6 weeks on oxybutynin 10 mg 3x11.  She said the muscle relaxant helped her abdominal pain in the past.   Today Last culture negative Patient is not a strong historian but she seems to be leaking a lot less both day and night and is happy on oxybutynin.  Clinically not infected.       PMH: Past Medical History:  Diagnosis Date   Alcoholism ([place])    Epigastric pain    GERD (gastroesophageal reflux disease)    Hypertension    Melena    Plantar fasciitis     Surgical History: Past Surgical History:  Procedure Laterality Date   COLONOSCOPY WITH PROPOFOL N/A 04/17/2017   Procedure: COLONOSCOPY WITH PROPOFOL;  Surgeon: Lucilla Lame, MD;  Location: [facility] ENDOSCOPY;  Service: Endoscopy;  Laterality: N/A;   ESOPHAGOGASTRODUODENOSCOPY (EGD) WITH PROPOFOL N/A 08/24/2017   Procedure: ESOPHAGOGASTRODUODENOSCOPY (EGD) WITH PROPOFOL;  Surgeon: Manya Silvas, MD;  Location: [facility] ENDOSCOPY;  Service: Endoscopy;  Laterality: N/A;    Home Medications:  Allergies as of 04/17/2022  Reactions   Penicillins         Medication List        Accurate as of April 17, 2022  9:37 AM. If you have any questions, ask your nurse or doctor.          cetirizine 10 MG tablet Commonly known as: ZYRTEC Take 10 mg by mouth daily.   mirabegron ER 50 MG Tb24 tablet Commonly known as: MYRBETRIQ Take 1 tablet (50 mg total) by mouth daily.   oxybutynin 10 MG 24 hr tablet Commonly known as: DITROPAN-XL Take 1 tablet (10 mg total) by mouth at bedtime.   venlafaxine XR 150 MG 24 hr capsule Commonly known as: EFFEXOR-XR venlafaxine ER 150 mg  capsule,extended release 24 hr   Ventolin HFA 108 (90 Base) MCG/ACT inhaler Generic drug: albuterol SMARTSIG:2 Puff(s) Via Inhaler 4 Times Daily PRN        Allergies:  Allergies  Allergen Reactions   Penicillins     Family History: History reviewed. No pertinent family history.  Social History:  reports that she has been smoking cigarettes. She has been smoking an average of 1 pack per day. She has been exposed to tobacco smoke. She has never used smokeless tobacco. She reports current alcohol use. She reports that she does not use drugs.  ROS:                                        Physical Exam: There were no vitals taken for this visit.  Constitutional:  Alert and oriented, No acute distress. HEENT: [place] AT, moist mucus membranes.  Trachea midline, no masses.   Laboratory Data: No results found for: "WBC", "HGB", "HCT", "MCV", "PLT"  Lab Results  Component Value Date   CREATININE 0.60 06/30/2017    No results found for: "PSA"  No results found for: "TESTOSTERONE"  No results found for: "HGBA1C"  Urinalysis    Component Value Date/Time   APPEARANCEUR Clear 03/06/2022 0931   GLUCOSEU Negative 03/06/2022 0931   BILIRUBINUR Negative 03/06/2022 0931   PROTEINUR Negative 03/06/2022 0931   NITRITE Negative 03/06/2022 0931   LEUKOCYTESUR Negative 03/06/2022 0931    Pertinent Imaging:   Assessment & Plan: Reassess in 6 months on oxybutynin  1. Mixed incontinence  - Urinalysis, Complete   No follow-ups on file.  Reece Packer, MD  [facility] [address] [place], [place] [address] [phone]

## 2022-04-20 LAB — CULTURE, URINE COMPREHENSIVE

## 2022-04-21 ENCOUNTER — Other Ambulatory Visit: Payer: Self-pay | Admitting: Gastroenterology

## 2022-05-09 ENCOUNTER — Ambulatory Visit
Admission: RE | Admit: 2022-05-09 | Discharge: 2022-05-09 | Disposition: A | Discharge status: Home / Self Care | Payer: Medicaid Other | Source: Ambulatory Visit | Attending: Family Medicine | Admitting: Family Medicine

## 2022-05-09 DIAGNOSIS — Z1231 Encounter for screening mammogram for malignant neoplasm of breast: Secondary | ICD-10-CM | POA: Insufficient documentation

## 2022-05-10 ENCOUNTER — Inpatient Hospital Stay
Admission: RE | Admit: 2022-05-10 | Discharge: 2022-05-10 | Disposition: A | Discharge status: Home / Self Care | Payer: Self-pay | Source: Ambulatory Visit | Attending: *Deleted | Admitting: *Deleted

## 2022-05-10 ENCOUNTER — Other Ambulatory Visit: Payer: Self-pay | Admitting: *Deleted

## 2022-05-10 DIAGNOSIS — Z1231 Encounter for screening mammogram for malignant neoplasm of breast: Secondary | ICD-10-CM

## 2022-08-24 ENCOUNTER — Other Ambulatory Visit: Payer: Self-pay

## 2022-08-28 ENCOUNTER — Ambulatory Visit: Payer: Medicaid Other | Admitting: Gastroenterology

## 2022-10-16 ENCOUNTER — Ambulatory Visit: Payer: Medicaid Other | Admitting: Urology

## 2022-10-23 ENCOUNTER — Ambulatory Visit (INDEPENDENT_AMBULATORY_CARE_PROVIDER_SITE_OTHER): Payer: Medicaid Other | Admitting: Urology

## 2022-10-23 VITALS — BP 143/86 | HR 109

## 2022-10-23 DIAGNOSIS — N3946 Mixed incontinence: Secondary | ICD-10-CM

## 2022-10-23 MED ORDER — SOLIFENACIN SUCCINATE 5 MG PO TABS
5.0000 mg | ORAL_TABLET | Freq: Every day | ORAL | 11 refills | Status: DC
Start: 2022-10-23 — End: 2022-10-23

## 2022-10-23 MED ORDER — SOLIFENACIN SUCCINATE 5 MG PO TABS
5.0000 mg | ORAL_TABLET | Freq: Every day | ORAL | 11 refills | Status: DC
Start: 2022-10-23 — End: 2023-03-26

## 2022-10-23 NOTE — Progress Notes (Signed)
10/23/2022 10:17 AM   Allison Barry March 09, 1961 [id number]  Referring provider: Oswaldo Conroy, MD [address] [place],  [place] [address]  Chief Complaint  Patient presents with   Follow-up    HPI: Reviewed lengthy note from April 17, 2022.  Based on comorbidities and number of observations I will treat her primarily has an overactive bladder and not perform a sling.  Says oxybutynin is not working that well.  Clinically not infected   PMH: Past Medical History:  Diagnosis Date   Alcoholism ([facility])    Epigastric pain    GERD (gastroesophageal reflux disease)    Hypertension    Melena    Plantar fasciitis     Surgical History: Past Surgical History:  Procedure Laterality Date   COLONOSCOPY WITH PROPOFOL N/A 04/17/2017   Procedure: COLONOSCOPY WITH PROPOFOL;  Surgeon: Midge Minium, MD;  Location: [facility] ENDOSCOPY;  Service: Endoscopy;  Laterality: N/A;   ESOPHAGOGASTRODUODENOSCOPY (EGD) WITH PROPOFOL N/A 08/24/2017   Procedure: ESOPHAGOGASTRODUODENOSCOPY (EGD) WITH PROPOFOL;  Surgeon: Scot Jun, MD;  Location[facility] ENDOSCOPY;  Service: Endoscopy;  Laterality: N/A;    Home Medications:  Allergies as of 10/23/2022       Reactions   Penicillins         Medication List        Accurate as of Oct 23, 2022 10:17 AM. If you have any questions, ask your nurse or doctor.          cetirizine 10 MG tablet Commonly known as: ZYRTEC Take 10 mg by mouth daily.   cyclobenzaprine 10 MG tablet Commonly known as: FLEXERIL Take 10 mg by mouth at bedtime as needed.   fluticasone 50 MCG/ACT nasal spray Commonly known as: FLONASE Place 1 spray into both nostrils daily.   mirabegron ER 50 MG Tb24 tablet Commonly known as: MYRBETRIQ Take 1 tablet (50 mg total) by mouth daily.   oxybutynin 10 MG 24 hr tablet Commonly known as: DITROPAN-XL Take 1 tablet (10 mg total) by mouth at bedtime.   venlafaxine XR 150 MG 24 hr capsule Commonly known  as: EFFEXOR-XR venlafaxine ER 150 mg capsule,extended release 24 hr   Ventolin HFA 108 (90 Base) MCG/ACT inhaler Generic drug: albuterol SMARTSIG:2 Puff(s) Via Inhaler 4 Times Daily PRN   Wixela Inhub 250-50 MCG/ACT Aepb Generic drug: fluticasone-salmeterol Inhale 1 puff into the lungs 2 (two) times daily.        Allergies:  Allergies  Allergen Reactions   Penicillins     Family History: Family History  Problem Relation Age of Onset   Breast cancer Mother     Social History:  reports that she has been smoking cigarettes. She has been smoking an average of 1 pack per day. She has been exposed to tobacco smoke. She has never used smokeless tobacco. She reports current alcohol use. She reports that she does not use drugs.  ROS:                                        Physical Exam: There were no vitals taken for this visit.  Constitutional:  Alert and oriented, No acute distress. HEENT: [place] AT, moist mucus membranes.  Trachea midline, no masses.   Laboratory Data: No results found for: "WBC", "HGB", "HCT", "MCV", "PLT"  Lab Results  Component Value Date   CREATININE 0.60 06/30/2017    No results found  for: "PSA"  No results found for: "TESTOSTERONE"  No results found for: "HGBA1C"  Urinalysis    Component Value Date/Time   APPEARANCEUR Clear 04/17/2022 0936   GLUCOSEU Negative 04/17/2022 0936   BILIRUBINUR Negative 04/17/2022 0936   PROTEINUR Negative 04/17/2022 0936   NITRITE Negative 04/17/2022 0936   LEUKOCYTESUR Negative 04/17/2022 0936    Pertinent Imaging:   Assessment & Plan: Reassess in 6 to 7 weeks on Vesicare 5 mg 30 x 11.  Proceed accordingly  1. Mixed incontinence  - Urinalysis, Complete   No follow-ups on file.  Martina Sinner, MD  [facility] [address] [place], [place] [address] [phone]

## 2022-12-18 ENCOUNTER — Ambulatory Visit: Payer: Medicaid Other | Admitting: Urology

## 2022-12-22 ENCOUNTER — Encounter: Payer: Self-pay | Admitting: Urology

## 2023-03-26 ENCOUNTER — Ambulatory Visit (INDEPENDENT_AMBULATORY_CARE_PROVIDER_SITE_OTHER): Payer: Medicaid Other | Admitting: Urology

## 2023-03-26 ENCOUNTER — Encounter: Payer: Self-pay | Admitting: Urology

## 2023-03-26 DIAGNOSIS — N3946 Mixed incontinence: Secondary | ICD-10-CM

## 2023-03-26 LAB — URINALYSIS, COMPLETE
Bilirubin, UA: NEGATIVE
Glucose, UA: NEGATIVE
Ketones, UA: NEGATIVE
Nitrite, UA: NEGATIVE
Protein,UA: NEGATIVE
Specific Gravity, UA: 1.01 (ref 1.005–1.030)
Urobilinogen, Ur: 0.2 mg/dL (ref 0.2–1.0)
pH, UA: 7 (ref 5.0–7.5)

## 2023-03-26 LAB — MICROSCOPIC EXAMINATION: WBC, UA: >30 /[HPF] — AB (ref 0–5)

## 2023-03-26 MED ORDER — GEMTESA 75 MG PO TABS: 1.0000 | ORAL_TABLET | Freq: Every day | ORAL | Status: DC

## 2023-03-26 NOTE — Addendum Note (Signed)
Addended by: Sueanne Margarita on: 03/26/2023 09:00 AM   Modules accepted: Orders

## 2023-03-26 NOTE — Progress Notes (Addendum)
03/26/2023 8:45 AM   Mirel K [place] 1961/05/14 [id number]  Referring provider: Oswaldo Conroy, MD [address] [place],  [place] [address]  No chief complaint on file.   HPI: Reviewed lengthy note from April 17, 2022. Based on comorbidities and number of observations I will treat her primarily has an overactive bladder and not perform a sling. Says oxybutynin is not working that well. Clinically not infected    Reassess in 6 to 7 weeks on Vesicare 5 mg 30 x 11. Proceed accordingly   Today Frequency stable.  Last culture positive.  In the last year she has had 2 positive cultures and 1 negative Patient still using 4 pads a day for mixed incontinence and primarily urge incontinence and bedwetting.  She has failed Myrbetriq.  Her history today was consistent with previous.  Clinically not infected   PMH: Past Medical History:  Diagnosis Date   Alcoholism (HCC)    Epigastric pain    GERD (gastroesophageal reflux disease)    Hypertension    Melena    Plantar fasciitis     Surgical History: Past Surgical History:  Procedure Laterality Date   COLONOSCOPY WITH PROPOFOL N/A 04/17/2017   Procedure: COLONOSCOPY WITH PROPOFOL;  Surgeon: Midge Minium, MD;  Location: [facility] ENDOSCOPY;  Service: Endoscopy;  Laterality: N/A;   ESOPHAGOGASTRODUODENOSCOPY (EGD) WITH PROPOFOL N/A 08/24/2017   Procedure: ESOPHAGOGASTRODUODENOSCOPY (EGD) WITH PROPOFOL;  Surgeon: Scot Jun, MD;  Location: [facility] ENDOSCOPY;  Service: Endoscopy;  Laterality: N/A;    Home Medications:  Allergies as of 03/26/2023       Reactions   Penicillins         Medication List        Accurate as of March 26, 2023  8:45 AM. If you have any questions, ask your nurse or doctor.          cetirizine 10 MG tablet Commonly known as: ZYRTEC Take 10 mg by mouth daily.   cyclobenzaprine 10 MG tablet Commonly known as: FLEXERIL Take 10 mg by mouth at bedtime as needed.   esomeprazole  40 MG capsule Commonly known as: NEXIUM Take 40 mg by mouth daily.   fluticasone 50 MCG/ACT nasal spray Commonly known as: FLONASE Place 1 spray into both nostrils daily.   levothyroxine 75 MCG tablet Commonly known as: SYNTHROID Take 75 mcg by mouth daily.   mirabegron ER 50 MG Tb24 tablet Commonly known as: MYRBETRIQ Take 1 tablet (50 mg total) by mouth daily.   oxybutynin 10 MG 24 hr tablet Commonly known as: DITROPAN-XL Take 1 tablet (10 mg total) by mouth at bedtime.   solifenacin 5 MG tablet Commonly known as: VESICARE Take 1 tablet (5 mg total) by mouth daily.   venlafaxine XR 150 MG 24 hr capsule Commonly known as: EFFEXOR-XR venlafaxine ER 150 mg capsule,extended release 24 hr   Ventolin HFA 108 (90 Base) MCG/ACT inhaler Generic drug: albuterol SMARTSIG:2 Puff(s) Via Inhaler 4 Times Daily PRN   Wixela Inhub 250-50 MCG/ACT Aepb Generic drug: fluticasone-salmeterol Inhale 1 puff into the lungs 2 (two) times daily.        Allergies:  Allergies  Allergen Reactions   Penicillins     Family History: Family History  Problem Relation Age of Onset   Breast cancer Mother     Social History:  reports that she has been smoking cigarettes. She has been exposed to tobacco smoke. She has never used smokeless tobacco. She reports current alcohol use. She reports that  she does not use drugs.  ROS:                                        Physical Exam: There were no vitals taken for this visit.  Constitutional:  Alert and oriented, No acute distress. HEENT: [place] AT, moist mucus membranes.  Trachea midline, no masses.   Laboratory Data: No results found for: "WBC", "HGB", "HCT", "MCV", "PLT"  Lab Results  Component Value Date   CREATININE 0.60 06/30/2017    No results found for: "PSA"  No results found for: "TESTOSTERONE"  No results found for: "HGBA1C"  Urinalysis    Component Value Date/Time   APPEARANCEUR Clear 04/17/2022  0936   GLUCOSEU Negative 04/17/2022 0936   BILIRUBINUR Negative 04/17/2022 0936   PROTEINUR Negative 04/17/2022 0936   NITRITE Negative 04/17/2022 0936   LEUKOCYTESUR Negative 04/17/2022 0936    Pertinent Imaging: Urine reviewed and sent for culture  Assessment & Plan: Reassess in 6 weeks on Gemtesa samples and prescription.  Call if culture positive.  I will order urodynamics next visit.  I think it is best under the circumstances.  Botox and percutaneous tibial nerve stimulation are potential options  If culture is positive I think will be a good idea to put her on prophylaxis see if this helps her urgency incontinence  There are no diagnoses linked to this encounter.  No follow-ups on file.  Martina Sinner, MD  [facility] [address] [place], [place] [address] [phone]

## 2023-03-28 LAB — CULTURE, URINE COMPREHENSIVE

## 2023-05-07 ENCOUNTER — Ambulatory Visit: Payer: Medicaid Other | Admitting: Urology

## 2023-05-07 ENCOUNTER — Encounter: Payer: Self-pay | Admitting: Urology

## 2023-05-07 DIAGNOSIS — N3946 Mixed incontinence: Secondary | ICD-10-CM

## 2023-05-07 LAB — URINALYSIS, COMPLETE
Bilirubin, UA: NEGATIVE
Glucose, UA: NEGATIVE
Ketones, UA: NEGATIVE
Nitrite, UA: NEGATIVE
Specific Gravity, UA: 1.01 (ref 1.005–1.030)
Urobilinogen, Ur: 0.2 mg/dL (ref 0.2–1.0)
pH, UA: 6 (ref 5.0–7.5)

## 2023-05-07 LAB — MICROSCOPIC EXAMINATION
Epithelial Cells (non renal): >10 /[HPF] — AB (ref 0–10)
WBC, UA: >30 /[HPF] — AB (ref 0–5)

## 2023-05-07 NOTE — Progress Notes (Signed)
05/07/2023 8:51 AM   Allison Barry 01-Jul-1960 [id number]  Referring provider: Oswaldo Conroy, MD [address] [place],  [place] [address]  Chief Complaint  Patient presents with   Urinary Incontinence   Follow-up    HPI: Reviewed lengthy note from April 17, 2022. Based on comorbidities and number of observations I will treat her primarily has an overactive bladder and not perform a sling. Says oxybutynin is not working that well. Clinically not infected    Reassess in 6 to 7 weeks on Vesicare 5 mg 30 x 11. Proceed accordingly    Today Frequency stable.  Last culture positive.  In the last year she has had 2 positive cultures and 1 negative Patient still using 4 pads a day for mixed incontinence and primarily urge incontinence and bedwetting.  She has failed Myrbetriq.  Her history today was consistent with previous.  Clinically not infected    Reassess in 6 weeks on Gemtesa samples and prescription.  Call if culture positive.  I will order urodynamics next visit.  I think it is best under the circumstances.  Botox and percutaneous tibial nerve stimulation are potential options   If culture is positive I think will be a good idea to put her on prophylaxis see if this helps her urgency incontinence  Today Frequency stable.  Last culture negative.  Patient is still leaking.  I explained the issue with urodynamics twice and the fluid shortage.  My nurse was a bit suspect as was I about issues that I have noted before.  She may have been slurring some.  She has had a previous negative cystoscopy but leaked around the scope and did not even know it in September 2023.   PMH: Past Medical History:  Diagnosis Date   Alcoholism (HCC)    Epigastric pain    GERD (gastroesophageal reflux disease)    Hypertension    Melena    Plantar fasciitis     Surgical History: Past Surgical History:  Procedure Laterality Date   COLONOSCOPY WITH PROPOFOL N/A 04/17/2017    Procedure: COLONOSCOPY WITH PROPOFOL;  Surgeon: Midge Minium, MD;  Location: [facility] ENDOSCOPY;  Service: Endoscopy;  Laterality: N/A;   ESOPHAGOGASTRODUODENOSCOPY (EGD) WITH PROPOFOL N/A 08/24/2017   Procedure: ESOPHAGOGASTRODUODENOSCOPY (EGD) WITH PROPOFOL;  Surgeon: Scot Jun, MD;  Location[facility] ENDOSCOPY;  Service: Endoscopy;  Laterality: N/A;    Home Medications:  Allergies as of 05/07/2023       Reactions   Penicillins         Medication List        Accurate as of May 07, 2023  8:51 AM. If you have any questions, ask your nurse or doctor.          STOP taking these medications    Gemtesa 75 MG Tabs Generic drug: Vibegron Stopped by: Lorin Picket A Jenesis Suchy   oxybutynin 10 MG 24 hr tablet Commonly known as: DITROPAN-XL Stopped by: Lorin Picket A Koren Sermersheim       TAKE these medications    amLODipine 5 MG tablet Commonly known as: NORVASC Take 5 mg by mouth daily.   ARIPiprazole 2 MG tablet Commonly known as: ABILIFY Take 2 mg by mouth daily.   cetirizine 10 MG tablet Commonly known as: ZYRTEC Take 10 mg by mouth daily.   cyclobenzaprine 10 MG tablet Commonly known as: FLEXERIL Take 10 mg by mouth at bedtime as needed.   esomeprazole 40 MG capsule Commonly known as: NEXIUM Take 40 mg by  mouth daily.   fluticasone 50 MCG/ACT nasal spray Commonly known as: FLONASE Place 1 spray into both nostrils daily.   levothyroxine 75 MCG tablet Commonly known as: SYNTHROID Take 75 mcg by mouth daily.   solifenacin 5 MG tablet Commonly known as: VESICARE Take 5 mg by mouth daily.   venlafaxine XR 75 MG 24 hr capsule Commonly known as: EFFEXOR-XR Take 75 mg by mouth daily. What changed: Another medication with the same name was removed. Continue taking this medication, and follow the directions you see here. Changed by: Lorin Picket A Kathrene Sinopoli   Ventolin HFA 108 (90 Base) MCG/ACT inhaler Generic drug: albuterol SMARTSIG:2 Puff(s) Via Inhaler 4 Times Daily  PRN   Wixela Inhub 250-50 MCG/ACT Aepb Generic drug: fluticasone-salmeterol Inhale 1 puff into the lungs 2 (two) times daily.        Allergies:  Allergies  Allergen Reactions   Penicillins     Family History: Family History  Problem Relation Age of Onset   Breast cancer Mother     Social History:  reports that she has been smoking cigarettes. She has been exposed to tobacco smoke. She has never used smokeless tobacco. She reports current alcohol use. She reports that she does not use drugs.  ROS:                                        Physical Exam: There were no vitals taken for this visit.  Constitutional:  Alert and oriented, No acute distress. HEENT: [place] AT, moist mucus membranes.  Trachea midline, no masses.  Laboratory Data: No results found for: "WBC", "HGB", "HCT", "MCV", "PLT"  Lab Results  Component Value Date   CREATININE 0.60 06/30/2017    No results found for: "PSA"  No results found for: "TESTOSTERONE"  No results found for: "HGBA1C"  Urinalysis    Component Value Date/Time   APPEARANCEUR Hazy (A) 03/26/2023 0850   GLUCOSEU Negative 03/26/2023 0850   BILIRUBINUR Negative 03/26/2023 0850   PROTEINUR Negative 03/26/2023 0850   NITRITE Negative 03/26/2023 0850   LEUKOCYTESUR 2+ (A) 03/26/2023 0850    Pertinent Imaging: Urine positive and sent for culture  Assessment & Plan: I spoke to her and her husband and he is willing to take care.  We will keep her fall and call her.  She had microscopic hematuria today but culture looked positive.  If it is negative and still present next visit I will order a CT scan.  The last 2 urinalysis prior to this were negative for blood 1. Mixed incontinence  - Urinalysis, Complete   No follow-ups on file.  Martina Sinner, MD  [facility] [address] [place], [place] [address] [phone]

## 2023-05-07 NOTE — Patient Instructions (Addendum)
Urodynamic testing, also known as a urodynamic study (UDS), is a series of tests that evaluate how well the bladder, urethra, and sphincters store and release urine

## 2024-04-15 NOTE — Progress Notes (Unsigned)
  Cardiology Office Note   Date:  04/17/2024  ID:  Allison Barry, DOB [birth year], MRN [id number] PCP: Kandis Stefano Iles, MD  [facility] Providers Cardiologist:  Caron Poser, MD     History of Present Illness Allison Barry is a 63 y.o. female PMH hypothyroidism, HTN who presents for further evaluation management of bilateral lower extremity edema.  Recently seen by PCP for this issue.  She was prescribed a 7-day course of low-dose Lasix.  Patient reports that she has bilateral lower extremity edema that essentially resolves at night after she sleeps and then gets progressively worse throughout the day.  She denies any significant DOE, chest discomfort, orthopnea, or other obvious signs and symptoms of heart failure.  She denies any known personal history of cardiovascular disease.  Per report, she has had relatively recent liver and kidney testing which were normal.  Relevant CVD History -None   ROS: Pt denies any chest discomfort, jaw pain, arm pain, palpitations, syncope, presyncope, orthopnea, PND, or LE edema.  Studies Reviewed I have independently reviewed the patient's ECG, recent medical records.  Physical Exam VS:  BP 106/82 (BP Location: Right Arm, Patient Position: Sitting, Cuff Size: Normal)   Pulse 93   Ht 5' 2 (1.575 m)   Wt 146 lb (66.2 kg)   SpO2 97%   BMI 26.70 kg/m        Wt Readings from Last 3 Encounters:  04/17/24 146 lb (66.2 kg)  04/17/22 120 lb (54.4 kg)  03/06/22 120 lb (54.4 kg)    GEN: No acute distress. NECK: No JVD; No carotid bruits. CARDIAC: RRR, no murmurs, rubs, gallops. RESPIRATORY:  Clear to auscultation. EXTREMITIES:  Warm and well-perfused.  +1 pitting edema bilaterally.  ASSESSMENT AND PLAN Bilateral lower extremity edema Patient presents with asymptomatic bilateral lower extremity edema.  The edema tends to get better after she has slept at night and then returns as the day goes on.  She denies any orthopnea,  DOE, chest discomfort, or any other cardiovascular symptoms.  Per her report, recent kidney and liver testing with her PCP were normal.  This seems most consistent with chronic venous insufficiency rather than a cardiovascular etiology.  Plan: - Echocardiogram to rule out heart failure or valvular disease as cause of edema - She is only on a medium dose of Norvasc, so would not expect this to cause significant edema. - Recent kidney and liver testing were normal per report; she has a normal liver ultrasound in our system from 2023. - Advised compression stockings; she reports that the ones she has at home are too tight.  I recommended that she get some looser fitting ones.  HTN Well-controlled.  Continue Norvasc 5 mg daily.  Could trial an alternative antihypertensive agent as well to see if Norvasc is the cause, but we will rule out cardiogenic etiology first as above.       Dispo: RTC as needed based on results of cardiac testing  Signed, Caron Poser, MD

## 2024-04-17 ENCOUNTER — Ambulatory Visit

## 2024-04-17 VITALS — BP 106/82 | HR 93 | Ht 62.0 in | Wt 146.0 lb

## 2024-04-17 DIAGNOSIS — R600 Localized edema: Secondary | ICD-10-CM | POA: Diagnosis not present

## 2024-04-17 DIAGNOSIS — I10 Essential (primary) hypertension: Secondary | ICD-10-CM | POA: Diagnosis not present

## 2024-04-17 NOTE — Patient Instructions (Signed)
 Medication Instructions:  Your physician recommends that you continue on your current medications as directed. Please refer to the Current Medication list given to you today.  *If you need a refill on your cardiac medications before your next appointment, please call your pharmacy*  Lab Work: No labs ordered today  If you have labs (blood work) drawn today and your tests are completely normal, you will receive your results only by: MyChart Message (if you have MyChart) OR A paper copy in the mail If you have any lab test that is abnormal or we need to change your treatment, we will call you to review the results.  Testing/Procedures: Your physician has requested that you have an echocardiogram. Echocardiography is a painless test that uses sound waves to create images of your heart. It provides your doctor with information about the size and shape of your heart and how well your heart's chambers and valves are working.   You may receive an ultrasound enhancing agent through an IV if needed to better visualize your heart during the echo. This procedure takes approximately one hour.  There are no restrictions for this procedure.  This will take place at [address] [facility] [address], [place] [address]  Please note: We ask at that you not bring children with you during ultrasound (echo/ vascular) testing. Due to room size and safety concerns, children are not allowed in the ultrasound rooms during exams. Our front office staff cannot provide observation of children in our lobby area while testing is being conducted. An adult accompanying a patient to their appointment will only be allowed in the ultrasound room at the discretion of the ultrasound technician under special circumstances. We apologize for any inconvenience.   Follow-Up: At [facility], you and your health needs are our priority.  As part of our continuing mission to provide you with exceptional heart  care, our providers are all part of one team.  This team includes your primary Cardiologist (physician) and Advanced Practice Providers or APPs (Physician Assistants and Nurse Practitioners) who all work together to provide you with the care you need, when you need it.  Your next appointment:   As Needed    Provider:   You may see Caron Poser, MD or one of the following Advanced Practice Providers on your designated Care Team:   Lonni Meager, NP Lesley Maffucci, PA-C Bernardino Bring, PA-C Cadence [place], PA-C Tylene Lunch, NP Barnie Hila, NP   We recommend signing up for the patient portal called MyChart.  Sign up information is provided on this After Visit Summary.  MyChart is used to connect with patients for Virtual Visits (Telemedicine).  Patients are able to view lab/test results, encounter notes, upcoming appointments, etc.  Non-urgent messages can be sent to your provider as well.   To learn more about what you can do with MyChart, go to [email or web address].

## 2024-06-10 ENCOUNTER — Ambulatory Visit
